# Patient Record
Sex: Female | Born: 1990 | Race: Black or African American | Hispanic: No | Marital: Single | Smoking: Current some day smoker
Health system: Southern US, Community
[De-identification: ages and names within clinical notes are randomized; demographics above are authoritative.]

## PROBLEM LIST (undated history)

## (undated) DIAGNOSIS — N83209 Unspecified ovarian cyst, unspecified side: Secondary | ICD-10-CM

## (undated) DIAGNOSIS — L309 Dermatitis, unspecified: Secondary | ICD-10-CM

## (undated) DIAGNOSIS — J45909 Unspecified asthma, uncomplicated: Secondary | ICD-10-CM

## (undated) DIAGNOSIS — F419 Anxiety disorder, unspecified: Secondary | ICD-10-CM

## (undated) DIAGNOSIS — F329 Major depressive disorder, single episode, unspecified: Secondary | ICD-10-CM

## (undated) DIAGNOSIS — F32A Depression, unspecified: Secondary | ICD-10-CM

## (undated) HISTORY — PX: HERNIA REPAIR: SHX51

---

## 2013-09-19 ENCOUNTER — Emergency Department: Payer: Self-pay

## 2013-09-19 LAB — COMPREHENSIVE METABOLIC PANEL
ALBUMIN: 3.6 g/dL (ref 3.4–5.0)
ALK PHOS: 59 U/L
Anion Gap: 6 — ABNORMAL LOW (ref 7–16)
BUN: 14 mg/dL (ref 7–18)
Bilirubin,Total: 0.8 mg/dL (ref 0.2–1.0)
CHLORIDE: 105 mmol/L (ref 98–107)
Calcium, Total: 9 mg/dL (ref 8.5–10.1)
Co2: 26 mmol/L (ref 21–32)
Creatinine: 0.71 mg/dL (ref 0.60–1.30)
EGFR (African American): >60
EGFR (Non-African Amer.): >60
GLUCOSE: 97 mg/dL (ref 65–99)
OSMOLALITY: 274 (ref 275–301)
POTASSIUM: 3.8 mmol/L (ref 3.5–5.1)
SGOT(AST): 13 U/L — ABNORMAL LOW (ref 15–37)
SGPT (ALT): 12 U/L (ref 12–78)
Sodium: 137 mmol/L (ref 136–145)
TOTAL PROTEIN: 7.5 g/dL (ref 6.4–8.2)

## 2013-09-19 LAB — CBC
HCT: 39.1 % (ref 35.0–47.0)
HGB: 12.8 g/dL (ref 12.0–16.0)
MCH: 29.6 pg (ref 26.0–34.0)
MCHC: 32.7 g/dL (ref 32.0–36.0)
MCV: 91 fL (ref 80–100)
PLATELETS: 194 10*3/uL (ref 150–440)
RBC: 4.32 10*6/uL (ref 3.80–5.20)
RDW: 12.8 % (ref 11.5–14.5)
WBC: 5.8 10*3/uL (ref 3.6–11.0)

## 2013-09-19 LAB — TSH: THYROID STIMULATING HORM: 1.83 u[IU]/mL

## 2013-09-19 LAB — ETHANOL
Ethanol %: <0.003 % (ref 0.000–0.080)
Ethanol: <3 mg/dL

## 2013-09-19 LAB — ACETAMINOPHEN LEVEL: Acetaminophen: <2 ug/mL

## 2013-09-19 LAB — SALICYLATE LEVEL: Salicylates, Serum: <1.7 mg/dL

## 2013-09-20 LAB — DRUG SCREEN, URINE
Amphetamines, Ur Screen: NEGATIVE (ref ?–1000)
BARBITURATES, UR SCREEN: NEGATIVE (ref ?–200)
Benzodiazepine, Ur Scrn: NEGATIVE (ref ?–200)
CANNABINOID 50 NG, UR ~~LOC~~: POSITIVE (ref ?–50)
Cocaine Metabolite,Ur ~~LOC~~: POSITIVE (ref ?–300)
MDMA (Ecstasy)Ur Screen: NEGATIVE (ref ?–500)
METHADONE, UR SCREEN: NEGATIVE (ref ?–300)
OPIATE, UR SCREEN: NEGATIVE (ref ?–300)
PHENCYCLIDINE (PCP) UR S: NEGATIVE (ref ?–25)
TRICYCLIC, UR SCREEN: NEGATIVE (ref ?–1000)

## 2013-09-20 LAB — URINALYSIS, COMPLETE
BACTERIA: NONE SEEN
Bilirubin,UR: NEGATIVE
Glucose,UR: NEGATIVE mg/dL (ref 0–75)
Ketone: NEGATIVE
NITRITE: NEGATIVE
Ph: 5 (ref 4.5–8.0)
Protein: NEGATIVE
Specific Gravity: 1.03 (ref 1.003–1.030)

## 2013-11-09 ENCOUNTER — Emergency Department: Payer: Self-pay | Admitting: Emergency Medicine

## 2013-11-09 LAB — URINALYSIS, COMPLETE
BILIRUBIN, UR: NEGATIVE
BLOOD: NEGATIVE
Bacteria: NONE SEEN
GLUCOSE, UR: NEGATIVE mg/dL (ref 0–75)
KETONE: NEGATIVE
LEUKOCYTE ESTERASE: NEGATIVE
NITRITE: NEGATIVE
PROTEIN: NEGATIVE
Ph: 7 (ref 4.5–8.0)
RBC,UR: 1 /HPF (ref 0–5)
Specific Gravity: 1.016 (ref 1.003–1.030)
WBC UR: <1 /HPF (ref 0–5)

## 2013-11-09 LAB — GC/CHLAMYDIA PROBE AMP

## 2013-11-09 LAB — PREGNANCY, URINE: Pregnancy Test, Urine: NEGATIVE m[IU]/mL

## 2013-11-09 LAB — WET PREP, GENITAL

## 2013-12-25 ENCOUNTER — Emergency Department: Payer: Self-pay | Admitting: Emergency Medicine

## 2013-12-25 LAB — URINALYSIS, COMPLETE
BILIRUBIN, UR: NEGATIVE
BLOOD: NEGATIVE
Bacteria: NONE SEEN
GLUCOSE, UR: NEGATIVE mg/dL (ref 0–75)
Leukocyte Esterase: NEGATIVE
NITRITE: NEGATIVE
PROTEIN: NEGATIVE
Ph: 5 (ref 4.5–8.0)
RBC,UR: <1 /HPF (ref 0–5)
Specific Gravity: 1.027 (ref 1.003–1.030)
Squamous Epithelial: 15

## 2013-12-25 LAB — WET PREP, GENITAL

## 2013-12-25 LAB — GC/CHLAMYDIA PROBE AMP

## 2014-04-18 ENCOUNTER — Emergency Department: Payer: Self-pay | Admitting: Emergency Medicine

## 2014-04-18 LAB — WET PREP, GENITAL

## 2014-04-19 LAB — GC/CHLAMYDIA PROBE AMP

## 2014-04-21 ENCOUNTER — Emergency Department: Payer: Self-pay | Admitting: Emergency Medicine

## 2014-08-06 ENCOUNTER — Emergency Department: Admit: 2014-08-06 | Disposition: A | Discharge status: Home / Self Care | Payer: Self-pay | Admitting: Student

## 2014-08-06 LAB — URINALYSIS, COMPLETE
Bacteria: NONE SEEN
Bilirubin,UR: NEGATIVE
Blood: NEGATIVE
Glucose,UR: NEGATIVE mg/dL (ref 0–75)
Ketone: NEGATIVE
Leukocyte Esterase: NEGATIVE
Nitrite: NEGATIVE
PH: 7 (ref 4.5–8.0)
PROTEIN: NEGATIVE
Specific Gravity: 1.013 (ref 1.003–1.030)

## 2014-08-06 LAB — CBC
HCT: 37 % (ref 35.0–47.0)
HGB: 11.8 g/dL — ABNORMAL LOW (ref 12.0–16.0)
MCH: 28.6 pg (ref 26.0–34.0)
MCHC: 32 g/dL (ref 32.0–36.0)
MCV: 89 fL (ref 80–100)
PLATELETS: 201 10*3/uL (ref 150–440)
RBC: 4.15 10*6/uL (ref 3.80–5.20)
RDW: 12.6 % (ref 11.5–14.5)
WBC: 5.6 10*3/uL (ref 3.6–11.0)

## 2014-08-06 LAB — COMPREHENSIVE METABOLIC PANEL
ALT: 9 U/L — AB
ANION GAP: 7 (ref 7–16)
Albumin: 4.1 g/dL
Alkaline Phosphatase: 40 U/L
BILIRUBIN TOTAL: 0.8 mg/dL
BUN: 13 mg/dL
CHLORIDE: 105 mmol/L
CO2: 26 mmol/L
Calcium, Total: 9.1 mg/dL
Creatinine: 0.68 mg/dL
EGFR (Non-African Amer.): >60
Glucose: 97 mg/dL
Potassium: 3.7 mmol/L
SGOT(AST): 15 U/L
SODIUM: 138 mmol/L
Total Protein: 7.2 g/dL

## 2014-08-06 LAB — LIPASE, BLOOD: Lipase: 49 U/L

## 2014-08-18 NOTE — Consult Note (Signed)
PATIENT NAME:  Ashley Bryan, Ashley Bryan MR#:  [id number] DATE OF BIRTH:  [birth year]  DATE OF CONSULTATION:  09/20/2013  CONSULTING PHYSICIAN:  Ashley Amel, MD  IDENTIFYING INFORMATION AND REASON FOR CONSULT: A 24 year old woman who was brought here under involuntary commitment from crisis services because of alleged suicidal ideation.   CHIEF COMPLAINT: "I didn't bring myself here." Consultation for psychiatric evaluation of patient under IVC.   HISTORY OF PRESENT ILLNESS: Information obtained from the patient and the chart. The patient went to crisis evaluation yesterday and admits that she was tearful at the time. They were under the impression that she was making statements about having suicidal ideation. The patient denies that she said anything suicidal and thinks that she was misinterpreted. She says that she has been under a lot of stress recently. She just moved to [place] from [place]. She has been off of her psychiatric medicines for several months. She finally admits to me that she did relapse into drug use when somebody offered her some cocaine recently. She says she was upset and worried that her life was going to get out of control, but denies that she had any suicidal thoughts at all. Denied that she was having any psychotic symptoms. She says she just wants to get back on her medication.   PAST PSYCHIATRIC HISTORY: The patient says she has had psychiatric problems since she was a child. She last had hospitalization when she was a young teenager, nothing in the last 7 years. Says she has been diagnosed with bipolar disorder. The only medicine she knows of that she was taking recently were Seroquel, Lexapro, and Xanax. She has been off of those for several months because she did not follow up and was not compliant with her treatment in [place]. She says she used to cut herself, but it has been years since she did that, and denies any serious suicide attempts.   SUBSTANCE ABUSE HISTORY:  Admits that she has a history of abuse, especially of cocaine, but had tried to get off it recently until she just relapsed the other day.   SOCIAL HISTORY: The patient gets disability. She has young children, 2 of whom live with her. They are currently, however, staying with her grandmother. She just moved from [place] to [place] to "make a new start."   PAST MEDICAL HISTORY: Mild asthma.   FAMILY HISTORY: Denies any.   CURRENT MEDICATIONS: None.   ALLERGIES: LATEX.   REVIEW OF SYSTEMS: Currently states she is not depressed. Denies suicidal ideation. Denies hallucinations or delusions. No pulmonary symptoms. No cardiac symptoms. No GI symptoms. Full review of systems negative.   MENTAL STATUS EXAMINATION: Neatly dressed and groomed woman, looks her stated age, cooperative with the interview. Good eye contact, normal psychomotor activity. Speech normal rate, tone and volume. Affect is slightly euphoric but controllable. Mood stated as being okay now. Thoughts are lucid. No loosening of associations. No evidence of delusions or racing thoughts or loosening of associations. Denies hallucinations. Denies suicidal or homicidal ideation. Shows adequate insight. Normal intelligence. Short and long-term memory intact. Alert and oriented x 4.   LABORATORY RESULTS: Drug screen is positive for cannabis and cocaine. Urinalysis: Borderline for infection. TSH normal. Alcohol level negative. Chemistry panel: No significant abnormalities. CBC normal.   ASSESSMENT: This is a 24 year old woman with chronic bipolar disorder, possibly borderline personality disorder, and history of substance abuse. She was upset and agitated after relapsing yesterday and made some statements that were interpreted as being suicidal, but  she is currently totally denying any suicidal ideation. She is agreeable to an outpatient plan and wants to get back on her medicine. She is lucid without psychosis. She is sober. She appears  motivated to take care of her children and get her life back on track. I do not think she meets commitment criteria anymore.   TREATMENT PLAN: She will be referred to local mental health agencies. I am also going to start her back on Seroquel 100 mg at night and Lexapro 20 mg a day, her quoted previous doses, and gave her a prescription for those. The patient was counseled about taking care of her mood disorder and completely staying off of any abusable drugs. She agrees that she needs to do this and will try and stay sober.   DIAGNOSIS, PRINCIPAL AND PRIMARY:  AXIS I: Bipolar disorder, not otherwise specified.   SECONDARY DIAGNOSES: AXIS I: Cocaine dependence.  AXIS II: Borderline personality disorder likely.  AXIS III: Mild asthma.  AXIS IV: Severe from recent stress of moving.  AXIS V: Functioning at time of evaluation 53.   ____________________________ Ashley AmelJohn T. Clapacs, MD jtc:jcm D: 09/20/2013 17:46:08 ET T: 09/20/2013 18:56:09 ET JOB#: [id number]  cc: Ashley AmelJohn T. Clapacs, MD, <Dictator> Ashley AmelJOHN T CLAPACS MD ELECTRONICALLY SIGNED 10/11/2013 13:20

## 2014-10-22 ENCOUNTER — Emergency Department (HOSPITAL_COMMUNITY): Payer: Self-pay

## 2014-10-22 ENCOUNTER — Emergency Department (HOSPITAL_COMMUNITY)
Admission: EM | Admit: 2014-10-22 | Discharge: 2014-10-22 | Discharge status: Against Medical Advice | Payer: Self-pay | Attending: Emergency Medicine | Admitting: Emergency Medicine

## 2014-10-22 ENCOUNTER — Encounter (HOSPITAL_COMMUNITY): Payer: Self-pay | Admitting: *Deleted

## 2014-10-22 DIAGNOSIS — J45909 Unspecified asthma, uncomplicated: Secondary | ICD-10-CM | POA: Insufficient documentation

## 2014-10-22 DIAGNOSIS — Z8659 Personal history of other mental and behavioral disorders: Secondary | ICD-10-CM | POA: Insufficient documentation

## 2014-10-22 DIAGNOSIS — Z872 Personal history of diseases of the skin and subcutaneous tissue: Secondary | ICD-10-CM | POA: Insufficient documentation

## 2014-10-22 DIAGNOSIS — R519 Headache, unspecified: Secondary | ICD-10-CM

## 2014-10-22 DIAGNOSIS — R51 Headache: Secondary | ICD-10-CM | POA: Insufficient documentation

## 2014-10-22 DIAGNOSIS — Z3202 Encounter for pregnancy test, result negative: Secondary | ICD-10-CM | POA: Insufficient documentation

## 2014-10-22 DIAGNOSIS — Z91040 Latex allergy status: Secondary | ICD-10-CM | POA: Insufficient documentation

## 2014-10-22 DIAGNOSIS — R52 Pain, unspecified: Secondary | ICD-10-CM

## 2014-10-22 DIAGNOSIS — R0789 Other chest pain: Secondary | ICD-10-CM | POA: Insufficient documentation

## 2014-10-22 DIAGNOSIS — R1031 Right lower quadrant pain: Secondary | ICD-10-CM | POA: Insufficient documentation

## 2014-10-22 DIAGNOSIS — Z720 Tobacco use: Secondary | ICD-10-CM | POA: Insufficient documentation

## 2014-10-22 DIAGNOSIS — Z8742 Personal history of other diseases of the female genital tract: Secondary | ICD-10-CM | POA: Insufficient documentation

## 2014-10-22 HISTORY — DX: Unspecified asthma, uncomplicated: J45.909

## 2014-10-22 HISTORY — DX: Dermatitis, unspecified: L30.9

## 2014-10-22 HISTORY — DX: Anxiety disorder, unspecified: F41.9

## 2014-10-22 HISTORY — DX: Depression, unspecified: F32.A

## 2014-10-22 HISTORY — DX: Unspecified ovarian cyst, unspecified side: N83.209

## 2014-10-22 HISTORY — DX: Major depressive disorder, single episode, unspecified: F32.9

## 2014-10-22 LAB — URINALYSIS, ROUTINE W REFLEX MICROSCOPIC
BILIRUBIN URINE: NEGATIVE
GLUCOSE, UA: NEGATIVE mg/dL
Hgb urine dipstick: NEGATIVE
Ketones, ur: NEGATIVE mg/dL
Leukocytes, UA: NEGATIVE
NITRITE: NEGATIVE
PH: 7 (ref 5.0–8.0)
Protein, ur: NEGATIVE mg/dL
Specific Gravity, Urine: 1.01 (ref 1.005–1.030)
Urobilinogen, UA: 0.2 mg/dL (ref 0.0–1.0)

## 2014-10-22 LAB — COMPREHENSIVE METABOLIC PANEL
ALT: 10 U/L — AB (ref 14–54)
AST: 13 U/L — AB (ref 15–41)
Albumin: 3.2 g/dL — ABNORMAL LOW (ref 3.5–5.0)
Alkaline Phosphatase: 38 U/L (ref 38–126)
Anion gap: 3 — ABNORMAL LOW (ref 5–15)
BUN: 11 mg/dL (ref 6–20)
CALCIUM: 8.4 mg/dL — AB (ref 8.9–10.3)
CO2: 29 mmol/L (ref 22–32)
CREATININE: 0.74 mg/dL (ref 0.44–1.00)
Chloride: 107 mmol/L (ref 101–111)
GFR calc Af Amer: >60 mL/min (ref >60)
GFR calc non Af Amer: >60 mL/min (ref >60)
Glucose, Bld: 86 mg/dL (ref 65–99)
POTASSIUM: 3.8 mmol/L (ref 3.5–5.1)
SODIUM: 139 mmol/L (ref 135–145)
TOTAL PROTEIN: 5.8 g/dL — AB (ref 6.5–8.1)
Total Bilirubin: 0.6 mg/dL (ref 0.3–1.2)

## 2014-10-22 LAB — POC URINE PREG, ED: PREG TEST UR: NEGATIVE

## 2014-10-22 LAB — CBC WITH DIFFERENTIAL/PLATELET
BASOS ABS: 0 10*3/uL (ref 0.0–0.1)
BASOS PCT: 1 % (ref 0–1)
Eosinophils Absolute: 0.2 10*3/uL (ref 0.0–0.7)
Eosinophils Relative: 4 % (ref 0–5)
HCT: 34.6 % — ABNORMAL LOW (ref 36.0–46.0)
Hemoglobin: 11.2 g/dL — ABNORMAL LOW (ref 12.0–15.0)
LYMPHS PCT: 35 % (ref 12–46)
Lymphs Abs: 1.5 10*3/uL (ref 0.7–4.0)
MCH: 29.2 pg (ref 26.0–34.0)
MCHC: 32.4 g/dL (ref 30.0–36.0)
MCV: 90.3 fL (ref 78.0–100.0)
Monocytes Absolute: 0.3 10*3/uL (ref 0.1–1.0)
Monocytes Relative: 7 % (ref 3–12)
NEUTROS ABS: 2.3 10*3/uL (ref 1.7–7.7)
Neutrophils Relative %: 53 % (ref 43–77)
PLATELETS: 192 10*3/uL (ref 150–400)
RBC: 3.83 MIL/uL — ABNORMAL LOW (ref 3.87–5.11)
RDW: 12.9 % (ref 11.5–15.5)
WBC: 4.3 10*3/uL (ref 4.0–10.5)

## 2014-10-22 NOTE — ED Notes (Signed)
Pt wanting update, instructed the pt that a doctor would see her soon.

## 2014-10-22 NOTE — ED Provider Notes (Signed)
CSN: [id number]     Arrival date & time 10/22/14  1051 History  This chart was scribed for Ashley BerkshireJoseph Cinsere Mizrahi, MD by Marica OtterNusrat Rahman, ED Scribe. This patient was seen in room APA18/APA18 and the patient's care was started at 12:48 PM.    Chief Complaint  Patient presents with  . Headache    Patient is a 24 y.o. female presenting with headaches, abdominal pain, and back pain. The history is provided by the patient. No language interpreter was used.  Headache Quality:  Sharp Severity currently:  8/10 Onset quality:  Sudden Timing:  Intermittent Chronicity:  Chronic Similar to prior headaches: yes   Relieved by:  None tried Ineffective treatments:  None tried Associated symptoms: no abdominal pain, no back pain, no congestion, no cough, no diarrhea, no fatigue, no seizures and no sinus pressure   Abdominal Pain Pain location:  RLQ Pain severity:  Unable to specify Timing:  Intermittent Chronicity:  Chronic Suspicious food intake: back pain.   Associated symptoms: no chest pain, no cough, no diarrhea, no fatigue and no hematuria   Back Pain Timing:  Intermittent Chronicity:  New Context: not falling, not jumping from heights, not MVA and not recent injury   Associated symptoms: no abdominal pain, no chest pain and no headaches     PCP: No PCP Per Patient HPI Comments: Ashley Bryan is a 24 y.o. female, with PMH noted below, who presents to the Emergency Department with multiple complaints.   Pt first complains of intermittent, 8/10, atraumatic, sharp headaches onset months ago.   Second, Pt complains of intermittent abd pain onset months ago.   Third, Pt complains of atraumatic, severe, intermittent ribcage pain onset yesterday.   Fourth, Pt complains of atraumatic, sudden onset back pain onset yesterday.   Past Medical History  Diagnosis Date  . Ovarian cyst   . Asthma   . Anxiety   . Depression   . Eczema    Past Surgical History  Procedure Laterality Date  . Hernia  repair     History reviewed. No pertinent family history. History  Substance Use Topics  . Smoking status: Current Some Day Smoker    Types: Cigarettes  . Smokeless tobacco: Not on file  . Alcohol Use: No   OB History    Gravida Para Term Preterm AB TAB SAB Ectopic Multiple Living   6 2   3  3   2      Review of Systems  Constitutional: Negative for appetite change and fatigue.  HENT: Negative for congestion, ear discharge and sinus pressure.   Eyes: Negative for discharge.  Respiratory: Negative for cough.   Cardiovascular: Negative for chest pain.  Gastrointestinal: Negative for abdominal pain and diarrhea.  Genitourinary: Negative for frequency and hematuria.  Musculoskeletal: Negative for back pain.  Skin: Negative for rash.  Neurological: Negative for seizures and headaches.  Psychiatric/Behavioral: Negative for hallucinations.   Allergies  Latex and Tylenol  Home Medications   Prior to Admission medications   Not on File   Triage Vitals: BP 124/75 mmHg  Pulse 95  Temp(Src) 98.9 F (37.2 C) (Oral)  Resp 18  Ht 5\' 4"  (1.626 m)  Wt 130 lb (58.968 kg)  BMI 22.30 kg/m2  SpO2 100%  LMP  Physical Exam  Constitutional: She is oriented to person, place, and time. She appears well-developed.  HENT:  Head: Normocephalic.  Eyes: Conjunctivae and EOM are normal. No scleral icterus.  Neck: Neck supple. No thyromegaly present.  Cardiovascular: Normal rate  and regular rhythm.  Exam reveals no gallop and no friction rub.   No murmur heard. Pulmonary/Chest: No stridor. She has no wheezes. She has no rales. She exhibits no tenderness.  Right sided chest wall tenderness   Abdominal: She exhibits no distension. There is tenderness (mild) in the right lower quadrant. There is no rebound.  Musculoskeletal: Normal range of motion. She exhibits no edema.  Lymphadenopathy:    She has no cervical adenopathy.  Neurological: She is oriented to person, place, and time. She exhibits  normal muscle tone. Coordination normal.  Skin: No rash noted. No erythema.  Psychiatric: She has a normal mood and affect. Her behavior is normal.    ED Course  Procedures (including critical care time) DIAGNOSTIC STUDIES: Oxygen Saturation is 100% on RA, nl by my interpretation.    COORDINATION OF CARE: 12:54 PM-Discussed treatment plan with pt at bedside and pt agreed to plan.   Labs Review Labs Reviewed  URINALYSIS, ROUTINE W REFLEX MICROSCOPIC (NOT AT [facility])  POC URINE PREG, ED    Imaging Review No results found.   EKG Interpretation None      MDM   Final diagnoses:  None   Pt left ama before I could discuss her results  The chart was scribed for me under my direct supervision.  I personally performed the history, physical, and medical decision making and all procedures in the evaluation of this patient.Ashley Berkshire, MD 10/22/14 1455

## 2014-10-22 NOTE — ED Notes (Signed)
Physician and nurse explained to pt he would go over results from test as soon as he could. PT stated she wanted to leave AMA. NAD noted and pt ambulated out through waiting room doors with significant other and 2 children stating loudly she didn't receive any pain medication today.

## 2014-10-22 NOTE — ED Notes (Addendum)
Frequent, sudden onset of sharp headaches in different locations over head x 3-4 days.  HAs are intermittent, lasting sometimes all day.  Applying pressure to site helps. Has not used any pain meds.  Also reports lump on R breast and pelvic pain that shifts from side to side. Last Depo shot was greater than 3 months ago. She has only had occasional spotting since then. No heterosexual activity.

## 2014-10-30 ENCOUNTER — Encounter: Payer: Self-pay | Admitting: Emergency Medicine

## 2014-10-30 ENCOUNTER — Emergency Department
Admission: EM | Admit: 2014-10-30 | Discharge: 2014-10-30 | Disposition: A | Discharge status: Home / Self Care | Payer: Self-pay | Attending: Emergency Medicine | Admitting: Emergency Medicine

## 2014-10-30 DIAGNOSIS — Z720 Tobacco use: Secondary | ICD-10-CM | POA: Insufficient documentation

## 2014-10-30 DIAGNOSIS — Z91040 Latex allergy status: Secondary | ICD-10-CM | POA: Insufficient documentation

## 2014-10-30 DIAGNOSIS — N764 Abscess of vulva: Secondary | ICD-10-CM | POA: Insufficient documentation

## 2014-10-30 MED ORDER — SULFAMETHOXAZOLE-TRIMETHOPRIM 800-160 MG PO TABS: 2.0000 | ORAL_TABLET | Freq: Two times a day (BID) | ORAL | Status: DC

## 2014-10-30 MED ORDER — LIDOCAINE-EPINEPHRINE (PF) 1 %-1:200000 IJ SOLN
INTRAMUSCULAR | Status: AC
  Filled 2014-10-30: qty 30

## 2014-10-30 MED ORDER — TRAMADOL HCL 50 MG PO TABS
ORAL_TABLET | ORAL | Status: AC
  Filled 2014-10-30: qty 2

## 2014-10-30 MED ORDER — SULFAMETHOXAZOLE-TRIMETHOPRIM 800-160 MG PO TABS
2.0000 | ORAL_TABLET | Freq: Two times a day (BID) | ORAL | Status: DC
  Administered 2014-10-30: 2 via ORAL

## 2014-10-30 MED ORDER — HYDROCODONE-ACETAMINOPHEN 5-325 MG PO TABS: 1.0000 | ORAL_TABLET | ORAL | Status: DC | PRN

## 2014-10-30 MED ORDER — SULFAMETHOXAZOLE-TRIMETHOPRIM 800-160 MG PO TABS
ORAL_TABLET | ORAL | Status: AC
  Filled 2014-10-30: qty 2

## 2014-10-30 MED ORDER — TRAMADOL HCL 50 MG PO TABS
100.0000 mg | ORAL_TABLET | Freq: Once | ORAL | Status: AC
  Administered 2014-10-30: 100 mg via ORAL

## 2014-10-30 NOTE — ED Provider Notes (Signed)
[facility] Emergency Department Provider Note ?____________________________________________ ? Time seen: 1636 ? I have reviewed the triage vital signs and the nursing notes. ________ HISTORY ? Chief Complaint Abscess  HPI  Ashley Bryan is a 24 y.o. female was to the ED for evaluation management of a vulvar abscess, for the last 2 weeks. She describes that it has been increasing in size, pain, and tightness, but denies any spontaneous drainage. She has been using warm compresses intermittently for the area. She reports history of recurrent small abscesses, but they generally resolve without intervention. She denies nausea, vomiting, but reports mild headaches, and subjective fevers. Pain at a 10 out of 10 in triage.  Past Medical History  Diagnosis Date  . Ovarian cyst   . Asthma   . Anxiety   . Depression   . Eczema    There are no active problems to display for this patient. ? Past Surgical History  Procedure Laterality Date  . Hernia repair    ? Current Outpatient Rx  Name  Route  Sig  Dispense  Refill  . HYDROcodone-acetaminophen (NORCO) 5-325 MG per tablet   Oral   Take 1 tablet by mouth every 4 (four) hours as needed for moderate pain.   10 tablet   0   . sulfamethoxazole-trimethoprim (BACTRIM DS,SEPTRA DS) 800-160 MG per tablet   Oral   Take 2 tablets by mouth 2 (two) times daily.   40 tablet   0    Allergies Latex and Tylenol ? History reviewed. No pertinent family history. ? Social History History  Substance Use Topics  . Smoking status: Current Some Day Smoker    Types: Cigarettes  . Smokeless tobacco: Not on file  . Alcohol Use: No  Review of Systems Constitutional: Negative for fever. HEENT:  Normocephalic/atraumatic. Negative for visual/hearingchanges, sore throat, or nasal congestion. Cardiovascular: Negative for chest pain. Respiratory: Negative for shortness of breath. Musculoskeletal: Negative for back  pain. Skin: vulvar abscess as above Neurological: Negative for headaches, focal weakness or numbness. Hematological/Lymphatic:Negative for enlarged lymph nodes  10-point ROS otherwise negative. ____________________________________________  PHYSICAL EXAM:  VITAL SIGNS: ED Triage Vitals  Enc Vitals Group     BP --      Pulse --      Resp --      Temp --      Temp src --      SpO2 --      Weight --      Height --      Head Cir --      Peak Flow --      Pain Score 10/30/14 1617 10     Pain Loc --      Pain Edu? --      Excl. in GC? --    Constitutional: Alert and oriented. Well appearing and in no distress. HEENT: Normocephalic and atraumatic.Conjunctivae are normal. PERRL. Normal extraocular movements. Mucous membranes are moist. Respiratory: Normal respiratory effort.  Genitourinary: normal external genitalia, except for a tense,firm, cystic lesion in the right labia. No pointing or fluctuance is noted.  Musculoskeletal: Nontender with normal range of motion in all extremities. Neurologic:  Normal speech and language. No gross focal neurologic deficits are appreciated.  Psychiatric: Mood and affect are normal. Speech and behavior are normal. Patient exhibits appropriate insight and judgment. _______________________ INCISION AND DRAINAGE  Performed by: Lissa HoardMenshew, Jenise V Bacon Consent: Verbal consent obtained. Risks and benefits: risks, benefits and alternatives were discussed Type: abscess  Body  area: vulva  Anesthesia: local infiltration  Incision was made with a scalpel.  Local anesthetic: lidocaine 1% w/ epinephrine  Anesthetic total: 3 ml  Complexity: complex Blunt dissection to break up loculations  Drainage: purulent  Drainage amount: scant  Patient tolerance: Patient tolerated the procedure well with no immediate complications.  Bactrim DS 2 tabs PO Ultram 100 mg PO ______________________________________________________ INITIAL IMPRESSION /  ASSESSMENT AND PLAN / ED COURSE ? Vulvar abscess s/p I&D. Prescription Bactrim DS for infection and Norco #10 for pain relief. Wound care instructions given.  ____________________________________________ FINAL CLINICAL IMPRESSION(S) / ED DIAGNOSES?  Final diagnoses:  Vulvar abscess      Lissa Hoard, PA-C 10/30/14 1738  I was in the ER and available for consult anytime the patient was here  Arnaldo Natal, MD 10/30/14 2231

## 2014-10-30 NOTE — Discharge Instructions (Signed)
Incision and Drainage Incision and drainage is a procedure in which a sac-like structure (cystic structure) is opened and drained. The area to be drained usually contains material such as pus, fluid, or blood.  LET YOUR CAREGIVER KNOW ABOUT:   Allergies to medicine.  Medicines taken, including vitamins, herbs, eyedrops, over-the-counter medicines, and creams.  Use of steroids (by mouth or creams).  Previous problems with anesthetics or numbing medicines.  History of bleeding problems or blood clots.  Previous surgery.  Other health problems, including diabetes and kidney problems.  Possibility of pregnancy, if this applies. RISKS AND COMPLICATIONS  Pain.  Bleeding.  Scarring.  Infection. BEFORE THE PROCEDURE  You may need to have an ultrasound or other imaging tests to see how large or deep your cystic structure is. Blood tests may also be used to determine if you have an infection or how severe the infection is. You may need to have a tetanus shot. PROCEDURE  The affected area is cleaned with a cleaning fluid. The cyst area will then be numbed with a medicine (local anesthetic). A small incision will be made in the cystic structure. A syringe or catheter may be used to drain the contents of the cystic structure, or the contents may be squeezed out. The area will then be flushed with a cleansing solution. After cleansing the area, it is often gently packed with a gauze or another wound dressing. Once it is packed, it will be covered with gauze and tape or some other type of wound dressing. AFTER THE PROCEDURE   Often, you will be allowed to go home right after the procedure.  You may be given antibiotic medicine to prevent or heal an infection.  If the area was packed with gauze or some other wound dressing, you will likely need to come back in 1 to 2 days to get it removed.  The area should heal in about 14 days. Document Released: 10/07/2000 Document Revised: 10/13/2011  Document Reviewed: 06/08/2011 [facility] Patient Information 2015 [place]. This information is not intended to replace advice given to you by your health care provider. Make sure you discuss any questions you have with your health care provider.  Vulvar Abscess  The vulva is made up of the large and small flaps of skin around the vagina opening. A vulvar abscess is an infected sac of yellowish white fluid (pus) in the skin flaps. Your doctor may make a small cut in the skin to drain the vulvar abscess.  HOME CARE  Only take medicine as told by your doctor.  Soak or take a warm water bath (sitz bath) 3 to 4 times a day for 15 to 20 minutes.  After you pee (urinate), always wipe from front to back.  Clean the vulvar abscess with soap and warm water. Do this after going to the bathroom.  Wear loose-fitting clothing.  Do not have sex until the vulvar abscess is gone or as told by your doctor. GET HELP RIGHT AWAY IF:   You have a temperature by mouth above 102 F (38.9 C).  The vulva area becomes more painful, puffy (swollen), or red.  You have fluid coming from the vulva area that is red or tan.  You have pain when you pee or have a hard time peeing. MAKE SURE YOU:  Understand these instructions.  Will watch your condition.  Will get help if you are not doing well or get worse. Document Released: 07/10/2008 Document Revised: 07/06/2011 Document Reviewed: 07/10/2008 ExitCare  Patient Information 2015 [place], [place]. This information is not intended to replace advice given to you by your health care provider. Make sure you discuss any questions you have with your health care provider.  Keep the area clean, dry, and covered.  Use warm, moist compresses daily to promote healing.  Take the prescription meds as directed for infection and pain relief. Return as needed for follow-up wound check.

## 2014-10-30 NOTE — ED Notes (Signed)
C/o abscess to vaginal area x 2 weeks, denies any drainage

## 2014-10-30 NOTE — ED Notes (Signed)
Pt to ed with c/o ? Abscess to vaginal area.  Pt states it has been there for about 2 weeks. Denies drainage.

## 2014-12-04 ENCOUNTER — Emergency Department
Admission: EM | Admit: 2014-12-04 | Discharge: 2014-12-06 | Disposition: A | Discharge status: Other Acute Inpatient Hospital | Payer: Self-pay | Attending: Emergency Medicine | Admitting: Emergency Medicine

## 2014-12-04 ENCOUNTER — Encounter: Payer: Self-pay | Admitting: *Deleted

## 2014-12-04 DIAGNOSIS — F3163 Bipolar disorder, current episode mixed, severe, without psychotic features: Secondary | ICD-10-CM

## 2014-12-04 DIAGNOSIS — Z91040 Latex allergy status: Secondary | ICD-10-CM | POA: Insufficient documentation

## 2014-12-04 DIAGNOSIS — Z720 Tobacco use: Secondary | ICD-10-CM | POA: Insufficient documentation

## 2014-12-04 DIAGNOSIS — Z792 Long term (current) use of antibiotics: Secondary | ICD-10-CM | POA: Insufficient documentation

## 2014-12-04 DIAGNOSIS — F418 Other specified anxiety disorders: Secondary | ICD-10-CM | POA: Insufficient documentation

## 2014-12-04 DIAGNOSIS — Z3202 Encounter for pregnancy test, result negative: Secondary | ICD-10-CM | POA: Insufficient documentation

## 2014-12-04 LAB — URINE DRUG SCREEN, QUALITATIVE (ARMC ONLY)
AMPHETAMINES, UR SCREEN: NOT DETECTED
BENZODIAZEPINE, UR SCRN: NOT DETECTED
Barbiturates, Ur Screen: NOT DETECTED
COCAINE METABOLITE, UR ~~LOC~~: NOT DETECTED
Cannabinoid 50 Ng, Ur ~~LOC~~: POSITIVE — AB
MDMA (Ecstasy)Ur Screen: NOT DETECTED
METHADONE SCREEN, URINE: NOT DETECTED
Opiate, Ur Screen: NOT DETECTED
Phencyclidine (PCP) Ur S: NOT DETECTED
TRICYCLIC, UR SCREEN: NOT DETECTED

## 2014-12-04 LAB — URINALYSIS COMPLETE WITH MICROSCOPIC (ARMC ONLY)
BACTERIA UA: NONE SEEN
Bilirubin Urine: NEGATIVE
GLUCOSE, UA: NEGATIVE mg/dL
Ketones, ur: NEGATIVE mg/dL
Nitrite: NEGATIVE
Protein, ur: NEGATIVE mg/dL
Specific Gravity, Urine: 1.016 (ref 1.005–1.030)
pH: 6 (ref 5.0–8.0)

## 2014-12-04 LAB — CBC WITH DIFFERENTIAL/PLATELET
Basophils Absolute: 0.1 10*3/uL (ref 0–0.1)
Basophils Relative: 1 %
EOS PCT: 2 %
Eosinophils Absolute: 0.1 10*3/uL (ref 0–0.7)
HEMATOCRIT: 39.9 % (ref 35.0–47.0)
Hemoglobin: 12.9 g/dL (ref 12.0–16.0)
LYMPHS ABS: 1.5 10*3/uL (ref 1.0–3.6)
Lymphocytes Relative: 21 %
MCH: 28.7 pg (ref 26.0–34.0)
MCHC: 32.3 g/dL (ref 32.0–36.0)
MCV: 88.9 fL (ref 80.0–100.0)
Monocytes Absolute: 0.4 10*3/uL (ref 0.2–0.9)
Monocytes Relative: 6 %
NEUTROS ABS: 4.9 10*3/uL (ref 1.4–6.5)
NEUTROS PCT: 70 %
Platelets: 230 10*3/uL (ref 150–440)
RBC: 4.49 MIL/uL (ref 3.80–5.20)
RDW: 12.9 % (ref 11.5–14.5)
WBC: 7 10*3/uL (ref 3.6–11.0)

## 2014-12-04 LAB — PREGNANCY, URINE: Preg Test, Ur: NEGATIVE

## 2014-12-04 LAB — BASIC METABOLIC PANEL
ANION GAP: 7 (ref 5–15)
BUN: 14 mg/dL (ref 6–20)
CALCIUM: 8.9 mg/dL (ref 8.9–10.3)
CO2: 28 mmol/L (ref 22–32)
CREATININE: 0.73 mg/dL (ref 0.44–1.00)
Chloride: 104 mmol/L (ref 101–111)
GFR calc Af Amer: >60 mL/min (ref >60)
GFR calc non Af Amer: >60 mL/min (ref >60)
GLUCOSE: 115 mg/dL — AB (ref 65–99)
POTASSIUM: 3.8 mmol/L (ref 3.5–5.1)
Sodium: 139 mmol/L (ref 135–145)

## 2014-12-04 LAB — ACETAMINOPHEN LEVEL: Acetaminophen (Tylenol), Serum: <10 ug/mL — ABNORMAL LOW (ref 10–30)

## 2014-12-04 LAB — SALICYLATE LEVEL: Salicylate Lvl: <4 mg/dL (ref 2.8–30.0)

## 2014-12-04 LAB — POCT PREGNANCY, URINE: Preg Test, Ur: NEGATIVE

## 2014-12-04 MED ORDER — LORAZEPAM 2 MG PO TABS
2.0000 mg | ORAL_TABLET | Freq: Once | ORAL | Status: AC
  Administered 2014-12-04: 2 mg via ORAL

## 2014-12-04 MED ORDER — LORAZEPAM 2 MG/ML IJ SOLN
INTRAMUSCULAR | Status: AC
  Filled 2014-12-04: qty 1

## 2014-12-04 MED ORDER — ZIPRASIDONE MESYLATE 20 MG IM SOLR: 20.0000 mg | Freq: Once | INTRAMUSCULAR | Status: DC

## 2014-12-04 MED ORDER — QUETIAPINE FUMARATE 25 MG PO TABS
ORAL_TABLET | ORAL | Status: AC
  Filled 2014-12-04: qty 4

## 2014-12-04 MED ORDER — QUETIAPINE FUMARATE 25 MG PO TABS
100.0000 mg | ORAL_TABLET | Freq: Every day | ORAL | Status: DC
  Administered 2014-12-04 – 2014-12-05 (×2): 100 mg via ORAL
  Filled 2014-12-04: qty 4

## 2014-12-04 MED ORDER — NICOTINE 10 MG IN INHA
RESPIRATORY_TRACT | Status: AC
  Filled 2014-12-04: qty 36

## 2014-12-04 MED ORDER — DIPHENHYDRAMINE HCL 50 MG PO CAPS
50.0000 mg | ORAL_CAPSULE | Freq: Once | ORAL | Status: AC
  Administered 2014-12-04: 50 mg via ORAL

## 2014-12-04 MED ORDER — ZIPRASIDONE MESYLATE 20 MG IM SOLR
INTRAMUSCULAR | Status: AC
  Filled 2014-12-04: qty 20

## 2014-12-04 MED ORDER — DIPHENHYDRAMINE HCL 50 MG PO CAPS
ORAL_CAPSULE | ORAL | Status: AC
  Filled 2014-12-04: qty 1

## 2014-12-04 MED ORDER — LORAZEPAM 2 MG PO TABS
ORAL_TABLET | ORAL | Status: AC
  Filled 2014-12-04: qty 1

## 2014-12-04 MED ORDER — NICOTINE 10 MG IN INHA
1.0000 | RESPIRATORY_TRACT | Status: DC | PRN
  Administered 2014-12-04: 1 via RESPIRATORY_TRACT

## 2014-12-04 MED ORDER — DIPHENHYDRAMINE HCL 50 MG/ML IJ SOLN
INTRAMUSCULAR | Status: AC
  Filled 2014-12-04: qty 1

## 2014-12-04 MED ORDER — ZIPRASIDONE HCL 20 MG PO CAPS
20.0000 mg | ORAL_CAPSULE | Freq: Two times a day (BID) | ORAL | Status: DC
  Administered 2014-12-05 (×2): 20 mg via ORAL
  Filled 2014-12-04 (×6): qty 1

## 2014-12-04 NOTE — Consult Note (Signed)
[facility] Face-to-Face Psychiatry Consult   Reason for Consult:  Consult for this 24 year old woman with a past history of mood instability who comes here on involuntary commitment from [facility] Referring Physician:  Quale Patient Identification: Ashley Bryan MRN:  [id number] Principal Diagnosis: Bipolar disorder, curr episode mixed, severe, w/o psychotic features Diagnosis:   Patient Active Problem List   Diagnosis Date Noted  . Bipolar disorder, curr episode mixed, severe, w/o psychotic features [F31.63] 12/04/2014    Total Time spent with patient: 1 hour  Subjective:   Ashley Bryan is a 24 y.o. female patient admitted with "I just need to get myself together".  HPI:  History from the patient and the chart. The patient was sent here on commitment petition from [facility] and is accompanied by an evaluation done today by Dr. Clovis Riley at [facility]. Patient presented there with multiple complaints and a lot of emotional lability. She gave him essentially what appears to be the same history and presentation she is showing here. She complains of a large number of severe stresses in her life including legal problems with the fear that she will be sent to jail. Worries a lot about her children. She seems to indicate that she sleeps poorly. Her mood seems to feel upset much of the time. I was not able to get to the point of inquiring about suicidality but she told Dr. Clovis Riley that she had suicidal thoughts today. She denies that she's been abusing any drugs recently. She has not gone for any outpatient treatment since she was last evaluated here 1 year ago. Patient is disorganized in her thinking but it was not possible to pin down specific delusions.  Past psychiatric history: Patient was seen by me here in the emergency room in spring of 2015 and at that time gave a history of long-standing mood lability. She was not committable at that time and was discharged from the emergency room on Seroquel and Lexapro. Sounds  like she has been only intermittently compliant with it and has somehow managed to still have a few around although I don't understand how that happens and she hasn't gone for any treatment in between. She was not able to tell me this time about any history of suicidality or past hospitalizations. I know that she does have a past history of bipolar disorder but also a past history of substance abuse problems.  Medical history: No significant medical problems identified outside of her mental health issues  Social history: Patient has 2 children ages 61 and 96 who live with her. I am unclear as to whether she is living with a partner right now. Patient is gay and has made multiple references to previous girlfriends but I'm not clear how involved they are in her life right now. It sounds like the children are currently in the custody of her grandmother.  Family history: Unknown  Substance abuse history: History of substance abuse problems in the past but says that she's been staying sober recently. HPI Elements:   Quality:  Mood instability and agitation. Severity:  Moderate possibly severe. Timing:  Long-standing intermittent problem. Duration:  Still going on here in the emergency room. Context:  Multiple legal problems.  Past Medical History:  Past Medical History  Diagnosis Date  . Ovarian cyst   . Asthma   . Anxiety   . Depression   . Eczema     Past Surgical History  Procedure Laterality Date  . Hernia repair     Family History: No family  history on file. Social History:  History  Alcohol Use No     History  Drug Use  . Yes  . Special: Marijuana    History   Social History  . Marital Status: Single    Spouse Name: N/A  . Number of Children: N/A  . Years of Education: N/A   Social History Main Topics  . Smoking status: Current Some Day Smoker    Types: Cigarettes  . Smokeless tobacco: Not on file  . Alcohol Use: No  . Drug Use: Yes    Special: Marijuana  . Sexual  Activity: Yes    Birth Control/ Protection: None     Comment: homosexual activity   Other Topics Concern  . None   Social History Narrative   Additional Social History:                          Allergies:   Allergies  Allergen Reactions  . Latex Rash  . Tylenol [Acetaminophen] Rash    Makes her ezcema worse    Labs:  Results for orders placed or performed during the hospital encounter of 12/04/14 (from the past 48 hour(s))  Pregnancy, urine     Status: None   Collection Time: 12/04/14  2:58 PM  Result Value Ref Range   Preg Test, Ur NEGATIVE NEGATIVE  Urinalysis complete, with microscopic     Status: Abnormal   Collection Time: 12/04/14  2:58 PM  Result Value Ref Range   Color, Urine YELLOW (A) YELLOW   APPearance HAZY (A) CLEAR   Glucose, UA NEGATIVE NEGATIVE mg/dL   Bilirubin Urine NEGATIVE NEGATIVE   Ketones, ur NEGATIVE NEGATIVE mg/dL   Specific Gravity, Urine 1.016 1.005 - 1.030   Hgb urine dipstick 1+ (A) NEGATIVE   pH 6.0 5.0 - 8.0   Protein, ur NEGATIVE NEGATIVE mg/dL   Nitrite NEGATIVE NEGATIVE   Leukocytes, UA TRACE (A) NEGATIVE   RBC / HPF 0-5 0 - 5 RBC/hpf   WBC, UA 0-5 0 - 5 WBC/hpf   Bacteria, UA NONE SEEN NONE SEEN   Squamous Epithelial / LPF 0-5 (A) NONE SEEN   Mucous PRESENT   Urine Drug Screen, Qualitative     Status: Abnormal   Collection Time: 12/04/14  2:58 PM  Result Value Ref Range   Tricyclic, Ur Screen NONE DETECTED NONE DETECTED   Amphetamines, Ur Screen NONE DETECTED NONE DETECTED   MDMA (Ecstasy)Ur Screen NONE DETECTED NONE DETECTED   Cocaine Metabolite,Ur [place] NONE DETECTED NONE DETECTED   Opiate, Ur Screen NONE DETECTED NONE DETECTED   Phencyclidine (PCP) Ur S NONE DETECTED NONE DETECTED   Cannabinoid 50 Ng, Ur [place] POSITIVE (A) NONE DETECTED   Barbiturates, Ur Screen NONE DETECTED NONE DETECTED   Benzodiazepine, Ur Scrn NONE DETECTED NONE DETECTED   Methadone Scn, Ur NONE DETECTED NONE DETECTED    Comment: (NOTE) 124   Tricyclics, urine               Cutoff 1000 ng/mL 200  Amphetamines, urine             Cutoff 1000 ng/mL 300  MDMA (Ecstasy), urine           Cutoff 500 ng/mL 400  Cocaine Metabolite, urine       Cutoff 300 ng/mL 500  Opiate, urine                   Cutoff 300 ng/mL 600  Phencyclidine (  PCP), urine      Cutoff 25 ng/mL 700  Cannabinoid, urine              Cutoff 50 ng/mL 800  Barbiturates, urine             Cutoff 200 ng/mL 900  Benzodiazepine, urine           Cutoff 200 ng/mL 1000 Methadone, urine                Cutoff 300 ng/mL 1100 1200 The urine drug screen provides only a preliminary, unconfirmed 1300 analytical test result and should not be used for non-medical 1400 purposes. Clinical consideration and professional judgment should 1500 be applied to any positive drug screen result due to possible 1600 interfering substances. A more specific alternate chemical method 1700 must be used in order to obtain a confirmed analytical result.  1800 Gas chromato graphy / mass spectrometry (GC/MS) is the preferred 1900 confirmatory method.   Basic metabolic panel     Status: Abnormal   Collection Time: 12/04/14  3:19 PM  Result Value Ref Range   Sodium 139 135 - 145 mmol/L   Potassium 3.8 3.5 - 5.1 mmol/L   Chloride 104 101 - 111 mmol/L   CO2 28 22 - 32 mmol/L   Glucose, Bld 115 (H) 65 - 99 mg/dL   BUN 14 6 - 20 mg/dL   Creatinine, Ser 0.73 0.44 - 1.00 mg/dL   Calcium 8.9 8.9 - 10.3 mg/dL   GFR calc non Af Amer >60 >60 mL/min   GFR calc Af Amer >60 >60 mL/min    Comment: (NOTE) The eGFR has been calculated using the CKD EPI equation. This calculation has not been validated in all clinical situations. eGFR's persistently <60 mL/min signify possible Chronic Kidney Disease.    Anion gap 7 5 - 15  Salicylate level     Status: None   Collection Time: 12/04/14  3:19 PM  Result Value Ref Range   Salicylate Lvl <4.7 2.8 - 30.0 mg/dL  Acetaminophen level     Status: Abnormal    Collection Time: 12/04/14  3:19 PM  Result Value Ref Range   Acetaminophen (Tylenol), Serum <10 (L) 10 - 30 ug/mL    Comment:        THERAPEUTIC CONCENTRATIONS VARY SIGNIFICANTLY. A RANGE OF 10-30 ug/mL MAY BE AN EFFECTIVE CONCENTRATION FOR MANY PATIENTS. HOWEVER, SOME ARE BEST TREATED AT CONCENTRATIONS OUTSIDE THIS RANGE. ACETAMINOPHEN CONCENTRATIONS >150 ug/mL AT 4 HOURS AFTER INGESTION AND >50 ug/mL AT 12 HOURS AFTER INGESTION ARE OFTEN ASSOCIATED WITH TOXIC REACTIONS.   CBC with Differential     Status: None   Collection Time: 12/04/14  3:19 PM  Result Value Ref Range   WBC 7.0 3.6 - 11.0 K/uL   RBC 4.49 3.80 - 5.20 MIL/uL   Hemoglobin 12.9 12.0 - 16.0 g/dL   HCT 39.9 35.0 - 47.0 %   MCV 88.9 80.0 - 100.0 fL   MCH 28.7 26.0 - 34.0 pg   MCHC 32.3 32.0 - 36.0 g/dL   RDW 12.9 11.5 - 14.5 %   Platelets 230 150 - 440 K/uL   Neutrophils Relative % 70 %   Neutro Abs 4.9 1.4 - 6.5 K/uL   Lymphocytes Relative 21 %   Lymphs Abs 1.5 1.0 - 3.6 K/uL   Monocytes Relative 6 %   Monocytes Absolute 0.4 0.2 - 0.9 K/uL   Eosinophils Relative 2 %   Eosinophils Absolute 0.1 0 - 0.7  K/uL   Basophils Relative 1 %   Basophils Absolute 0.1 0 - 0.1 K/uL  Pregnancy, urine POC     Status: None   Collection Time: 12/04/14  3:21 PM  Result Value Ref Range   Preg Test, Ur NEGATIVE NEGATIVE    Comment:        THE SENSITIVITY OF THIS METHODOLOGY IS >24 mIU/mL     Vitals: Blood pressure 108/66, temperature 98.3 F (36.8 C), temperature source Oral, resp. rate 20, height 5' 5" (1.651 m), weight 63.504 kg (140 lb), SpO2 99 %.  Risk to Self: Is patient at risk for suicide?: No, but patient needs Medical Clearance Risk to Others:   Prior Inpatient Therapy:   Prior Outpatient Therapy:    Current Facility-Administered Medications  Medication Dose Route Frequency Provider Last Rate Last Dose  . diphenhydrAMINE (BENADRYL) 50 MG/ML injection           . LORazepam (ATIVAN) 2 MG/ML injection            . ziprasidone (GEODON) 20 MG injection           . ziprasidone (GEODON) capsule 20 mg  20 mg Oral BID WC Carrie Mew, MD      . ziprasidone (GEODON) injection 20 mg  20 mg Intramuscular Once Delman Kitten, MD       Current Outpatient Prescriptions  Medication Sig Dispense Refill  . HYDROcodone-acetaminophen (NORCO) 5-325 MG per tablet Take 1 tablet by mouth every 4 (four) hours as needed for moderate pain. 10 tablet 0  . sulfamethoxazole-trimethoprim (BACTRIM DS,SEPTRA DS) 800-160 MG per tablet Take 2 tablets by mouth 2 (two) times daily. 40 tablet 0    Musculoskeletal: Strength & Muscle Tone: within normal limits Gait & Station: normal Patient leans: N/A  Psychiatric Specialty Exam: Physical Exam  Constitutional: She appears well-developed and well-nourished.  HENT:  Head: Normocephalic and atraumatic.  Eyes: Conjunctivae are normal. Pupils are equal, round, and reactive to light.  Neck: Normal range of motion.  Cardiovascular: Normal heart sounds.   Respiratory: Effort normal.  GI: Soft.  Musculoskeletal: Normal range of motion.  Neurological: She is alert.  Skin: Skin is warm and dry.  Psychiatric: Her affect is labile. Her speech is tangential. She is agitated. Thought content is paranoid. Cognition and memory are normal. She expresses impulsivity and inappropriate judgment. She expresses suicidal ideation.  Patient presents as a neatly groomed woman looks her stated age. Her affect is very labile. Speech content is tangential to the point of being almost incoherent at times. Very emotionally driven and upset. Made suicidal statements earlier today to the psychiatrist at [facility]  Constitutional: Negative.   HENT: Negative.   Eyes: Negative.   Respiratory: Negative.   Cardiovascular: Negative.   Gastrointestinal: Negative.   Musculoskeletal: Negative.   Skin: Negative.   Neurological: Negative.   Psychiatric/Behavioral: Positive for depression and  suicidal ideas. Negative for hallucinations and substance abuse. The patient is nervous/anxious and has insomnia.     Blood pressure 108/66, temperature 98.3 F (36.8 C), temperature source Oral, resp. rate 20, height 5' 5" (1.651 m), weight 63.504 kg (140 lb), SpO2 99 %.Body mass index is 23.3 kg/(m^2).  General Appearance: Fairly Groomed  Engineer, water::  Fair  Speech:  Pressured  Volume:  Increased  Mood:  Irritable  Affect:  Labile  Thought Process:  Circumstantial and Tangential  Orientation:  Full (Time, Place, and Person)  Thought Content:  Unclear. Could not  pin down her history well enough to be certain  Suicidal Thoughts:  Yes.  with intent/plan  Homicidal Thoughts:  No  Memory:  Immediate;   Fair Recent;   Fair Remote;   Fair  Judgement:  Impaired  Insight:  Lacking  Psychomotor Activity:  Restlessness  Concentration:  Poor  Recall:  [employer] of Knowledge:Good  Language: Fair  Akathisia:  No  Handed:  Right  AIMS (if indicated):     Assets:  Communication Skills Desire for Improvement Physical Health  ADL's:  Intact  Cognition: WNL  Sleep:      Medical Decision Making: Review of Psycho-Social Stressors (1), Review or order clinical lab tests (1), Established Problem, Worsening (2), Review or order medicine tests (1) and Review of Medication Regimen & Side Effects (2)  Treatment Plan Summary: Plan Patient is presenting as an emotionally labile and upset agitated young woman. It's unclear whether she is having any specifically psychotic symptoms. She has been at times verbally explosive here in the emergency room but has not made an effort to hurt herself or anyone else. She made suicidal statements at [facility] before being referred here. As reflected in Dr. Georgina Snell note I would agree that it is hard to determine if this could be a bipolar disorder or a personality disorder as the 2 most likely explanations. Drug screen is negative. Does not seem to be substance induced.  Currently we do not have any beds available on the psychiatry ward. I have advised the emergency room doctor to try a injection with 20 mg of intramuscular Geodon for her acute agitation. I will put her back on Seroquel that she was taking previously. Patient can be reevaluated if we have bed space available tomorrow and possibly admitted at that time.  Plan:  Recommend psychiatric Inpatient admission when medically cleared. Supportive therapy provided about ongoing stressors. Disposition: Continue monitoring in the emergency room until bed space available  Alethia Berthold 12/04/2014 5:41 PM

## 2014-12-04 NOTE — ED Notes (Signed)
Pt. Noted sleeping in room. No complaints or concerns voiced. No distress or abnormal behavior noted. Will continue to monitor with security cameras. Q 15 minute rounds continue. 

## 2014-12-04 NOTE — ED Notes (Signed)
Pt arrives in handcuffs, crying and cursing and upset.

## 2014-12-04 NOTE — ED Notes (Signed)
Pt. To [facility] from ED ambulatory without difficulty, to room  . Report from RN. Pt. Is alert and oriented, warm and dry in no distress. Pt. Denies SI, HI, and AVH. Pt. Calm and cooperative. Pt. Made aware of security cameras and Q15 minute rounds. Pt. Encouraged to let Nursing staff know of any concerns or needs.   

## 2014-12-04 NOTE — ED Notes (Addendum)
Pt. Noted sleeping in room. No complaints or concerns voiced. No distress or abnormal behavior noted. Will continue to monitor with security cameras. Q 15 minute rounds continue. 

## 2014-12-04 NOTE — ED Notes (Signed)
Pt. Noted in room. Pt. C/o insomnia. No distress or abnormal behavior noted. Will continue to monitor with security cameras. Q 15 minute rounds continue. 

## 2014-12-04 NOTE — ED Provider Notes (Addendum)
-----------------------------------------   5:28 PM on 12/04/2014 -----------------------------------------   Blood pressure 108/66, temperature 98.3 F (36.8 C), temperature source Oral, resp. rate 20, height  (1.651 m), weight 140 lb (63.504 kg), SpO2 99 %.  Patient has begun yelling out at the room, yelling profanities at Dr. Toni Amend because she is upset with his assessment. The patient is very agitated at this time, she is making aggressive gestures and swearing profanely at the top of her lungs. Discussed with Dr. Toni Amend and we will administer Geodon 20 mg, and continue her on her IVC. May require additional sedation thereafter, but we'll continue to monitor.     Sharyn Creamer, MD 12/04/14 1728  ----------------------------------------- 6:10 PM on 12/04/2014 -----------------------------------------  Patient currently resting comfortably. No further outbursts or agitation. Medically cleared for psychiatric evaluation and referral at this time.  Sharyn Creamer, MD 12/04/14 [phone]
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
## 2014-12-04 NOTE — ED Notes (Signed)
Psychiatrist is at the bedside with psych pa seeing pt.  Pt has been calm and cooperative

## 2014-12-05 MED ORDER — LORAZEPAM 2 MG/ML IJ SOLN
2.0000 mg | Freq: Once | INTRAMUSCULAR | Status: AC
  Administered 2014-12-05: 2 mg via INTRAMUSCULAR

## 2014-12-05 MED ORDER — HALOPERIDOL LACTATE 5 MG/ML IJ SOLN
10.0000 mg | Freq: Once | INTRAMUSCULAR | Status: AC
  Administered 2014-12-05: 10 mg via INTRAMUSCULAR

## 2014-12-05 MED ORDER — LORAZEPAM 2 MG/ML IJ SOLN
INTRAMUSCULAR | Status: AC
  Filled 2014-12-05: qty 1

## 2014-12-05 MED ORDER — DIPHENHYDRAMINE HCL 50 MG/ML IJ SOLN
INTRAMUSCULAR | Status: AC
Start: 2014-12-05 — End: 2014-12-05
  Filled 2014-12-05: qty 1

## 2014-12-05 MED ORDER — DIPHENHYDRAMINE HCL 50 MG/ML IJ SOLN
50.0000 mg | Freq: Once | INTRAMUSCULAR | Status: AC
  Administered 2014-12-05: 50 mg via INTRAMUSCULAR

## 2014-12-05 MED ORDER — HALOPERIDOL LACTATE 5 MG/ML IJ SOLN
INTRAMUSCULAR | Status: AC
Start: 2014-12-05 — End: 2014-12-05
  Filled 2014-12-05: qty 1

## 2014-12-05 NOTE — ED Notes (Signed)
BEHAVIORAL HEALTH ROUNDING Patient sleeping: No. Patient alert and oriented: yes Behavior appropriate: No.; If no, describe:  Nutrition and fluids offered: Yes  Toileting and hygiene offered: Yes  Sitter present: no Law enforcement present: Yes  

## 2014-12-05 NOTE — ED Notes (Signed)

## 2014-12-05 NOTE — ED Notes (Signed)
Once pt was awakened today for meds pt began to become irate, yelling requesting to use phone.

## 2014-12-05 NOTE — ED Notes (Signed)
Pt tolerated taking meds well, calm and cooperative, given phone at this time to contact grandmother via Engineer, materials.

## 2014-12-05 NOTE — ED Notes (Signed)
Orders received for IM meds, talked to pt extensively for over 15 minutes.  Pt finally agreed to take IM meds without difficulty

## 2014-12-05 NOTE — ED Notes (Signed)
BEHAVIORAL HEALTH ROUNDING Patient sleeping: Yes.   Patient alert and oriented: no Behavior appropriate: Yes.  ; If no, describe:  Nutrition and fluids offered: Yes  Toileting and hygiene offered: Yes  Sitter present: no Law enforcement present: Yes  
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
## 2014-12-05 NOTE — ED Notes (Signed)
BEHAVIORAL HEALTH ROUNDING Patient sleeping: Yes.  , chest rise and fall noted, no acute distress noted  Patient alert and oriented: yes Behavior appropriate: Yes.  ;  Nutrition and fluids offered: Yes  Toileting and hygiene offered: Yes  Sitter present: yes Law enforcement present: Yes

## 2014-12-05 NOTE — ED Notes (Signed)
Pt becoming more irritated, yelling, screaming using profanities.  Pt banging on door, yelling.  Called and notified charge RN, and ed MD.

## 2014-12-05 NOTE — ED Notes (Signed)
Pt sleeping at this time, continuing to monitor through cameras.  Awaiting breakfast.

## 2014-12-05 NOTE — ED Notes (Signed)
Pt. Noted sleeping in room. No complaints or concerns voiced. No distress or abnormal behavior noted. Will continue to monitor with security cameras. Q 15 minute rounds continue. 

## 2014-12-05 NOTE — ED Notes (Signed)
ED BHU PLACEMENT JUSTIFICATION Is the patient under IVC or is there intent for IVC: Yes.   Is the patient medically cleared: Yes.   Is there vacancy in the ED BHU: Yes.   Is the population mix appropriate for patient: Yes.   Is the patient awaiting placement in inpatient or outpatient setting: Yes.   Has the patient had a psychiatric consult: No. Survey of unit performed for contraband, proper placement and condition of furniture, tampering with fixtures in bathroom, shower, and each patient room: Yes.  ; Findings:  APPEARANCE/BEHAVIOR uncooperative NEURO ASSESSMENT Orientation: place Hallucinations: No.None noted (Hallucinations) Speech: Pressured Gait: normal RESPIRATORY ASSESSMENT No chest wall tenderness. CARDIOVASCULAR ASSESSMENT regular rate and rhythm, S1, S2 normal, no murmur, click, rub or gallop GASTROINTESTINAL ASSESSMENT soft, nontender, BS WNL, no r/g EXTREMITIES normal strength, tone, and muscle mass, no deformities, no erythema, induration, or nodules PLAN OF CARE Provide calm/safe environment. Vital signs assessed twice daily. ED BHU Assessment once each 12-hour shift. Collaborate with intake RN daily or as condition indicates. Assure the ED provider has rounded once each shift. Provide and encourage hygiene. Provide redirection as needed. Assess for escalating behavior; address immediately and inform ED provider.  Assess family dynamic and appropriateness for visitation as needed: Yes.  ; If necessary, describe findings: pt screaming profanities at this time.  Educate the patient/family about BHU procedures/visitation: Yes.  ; If necessary, describe findings:
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
## 2014-12-05 NOTE — ED Notes (Addendum)
Pt resting at this time, 15 minutes rounds continue with observation by security cameras.

## 2014-12-05 NOTE — ED Notes (Signed)
Report received from [facility]. Pt currently sleeping in bed comfortably at this time, symmetrical chest rise and fall noted. No acute distress noted at this time.

## 2014-12-05 NOTE — ED Notes (Signed)
Patient assigned to appropriate care area. Patient oriented to unit/care area: Informed that, for their safety, care areas are designed for safety and monitored by security cameras at all times; and visiting hours explained to patient. Patient verbalizes understanding, and verbal contract for safety obtained. 

## 2014-12-05 NOTE — ED Notes (Signed)
BEHAVIORAL HEALTH ROUNDING  Patient sleeping: Yes.  Patient alert and oriented: Pt is sleeping.  Behavior appropriate: Yes. ; If no, describe: Pt is sleeping.  Nutrition and fluids offered: No  Toileting and hygiene offered: No  Sitter present: yes  Law enforcement present: Yes   

## 2014-12-05 NOTE — ED Notes (Addendum)
Dr. Mayford Knife at bedside, Tammy Sours RN also at bedside. Security called for back up,  Pt continues to yell and scream profanities.
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
## 2014-12-05 NOTE — ED Notes (Addendum)
BEHAVIORAL HEALTH ROUNDING Patient sleeping: Yes.   Patient alert and oriented: yes Behavior appropriate: Yes.  ;  Nutrition and fluids offered: yes Toileting and hygiene offered: Yes  Sitter present: no not needed per md order Law enforcement present: Yes

## 2014-12-05 NOTE — ED Notes (Signed)
Report received from Gary RN, care assumed.  Pt sleeping at this time.  

## 2014-12-05 NOTE — ED Notes (Signed)
Report to Johnney Ou RN.

## 2014-12-05 NOTE — ED Notes (Signed)
ED BHU PLACEMENT JUSTIFICATION Is the patient under IVC or is there intent for IVC: Yes.   Is the patient medically cleared: Yes.   Is there vacancy in the ED BHU: Yes.   Is the population mix appropriate for patient: Yes.   Is the patient awaiting placement in inpatient or outpatient setting: Yes.   Has the patient had a psychiatric consult: Yes.   Survey of unit performed for contraband, proper placement and condition of furniture, tampering with fixtures in bathroom, shower, and each patient room: Yes.  ; Findings: none APPEARANCE/BEHAVIOR calm, cooperative and adequate rapport can be established NEURO ASSESSMENT Orientation: time, place and person Hallucinations: No.None noted (Hallucinations) Speech: Normal Gait: Pt in bed, unable to assess.  RESPIRATORY ASSESSMENT Normal expansion.  Clear to auscultation.  No rales, rhonchi, or wheezing., No chest wall tenderness., No kyphosis or scoliosis. CARDIOVASCULAR ASSESSMENT regular rate and rhythm, S1, S2 normal, no murmur, click, rub or gallop GASTROINTESTINAL ASSESSMENT soft, nontender, BS WNL, no r/g EXTREMITIES normal strength, tone, and muscle mass, no deformities, no erythema, induration, or nodules, no evidence of joint effusion, ROM of all joints is normal, no evidence of joint instability PLAN OF CARE Provide calm/safe environment. Vital signs assessed twice daily. ED BHU Assessment once each 12-hour shift. Collaborate with intake RN daily or as condition indicates. Assure the ED provider has rounded once each shift. Provide and encourage hygiene. Provide redirection as needed. Assess for escalating behavior; address immediately and inform ED provider.  Assess family dynamic and appropriateness for visitation as needed: No.; If necessary, describe findings: Family not present, unable to assess family dynamics.  Educate the patient/family about BHU procedures/visitation: Yes.  ; If necessary, describe findings: Pt is cooperative.

## 2014-12-06 ENCOUNTER — Encounter: Payer: Self-pay | Admitting: *Deleted

## 2014-12-06 ENCOUNTER — Inpatient Hospital Stay
Admit: 2014-12-06 | Discharge: 2014-12-07 | DRG: 885 | Disposition: A | Discharge status: Home / Self Care | Payer: No Typology Code available for payment source | Attending: Psychiatry | Admitting: Psychiatry

## 2014-12-06 DIAGNOSIS — Z818 Family history of other mental and behavioral disorders: Secondary | ICD-10-CM

## 2014-12-06 DIAGNOSIS — Z653 Problems related to other legal circumstances: Secondary | ICD-10-CM

## 2014-12-06 DIAGNOSIS — F1010 Alcohol abuse, uncomplicated: Secondary | ICD-10-CM | POA: Diagnosis present

## 2014-12-06 DIAGNOSIS — F909 Attention-deficit hyperactivity disorder, unspecified type: Secondary | ICD-10-CM | POA: Diagnosis present

## 2014-12-06 DIAGNOSIS — F17210 Nicotine dependence, cigarettes, uncomplicated: Secondary | ICD-10-CM | POA: Diagnosis present

## 2014-12-06 DIAGNOSIS — F819 Developmental disorder of scholastic skills, unspecified: Secondary | ICD-10-CM | POA: Diagnosis present

## 2014-12-06 DIAGNOSIS — F4310 Post-traumatic stress disorder, unspecified: Secondary | ICD-10-CM | POA: Diagnosis present

## 2014-12-06 DIAGNOSIS — R45851 Suicidal ideations: Secondary | ICD-10-CM | POA: Diagnosis present

## 2014-12-06 DIAGNOSIS — F3163 Bipolar disorder, current episode mixed, severe, without psychotic features: Secondary | ICD-10-CM | POA: Diagnosis present

## 2014-12-06 DIAGNOSIS — Z915 Personal history of self-harm: Secondary | ICD-10-CM | POA: Diagnosis not present

## 2014-12-06 DIAGNOSIS — J45909 Unspecified asthma, uncomplicated: Secondary | ICD-10-CM | POA: Diagnosis present

## 2014-12-06 DIAGNOSIS — F410 Panic disorder [episodic paroxysmal anxiety] without agoraphobia: Secondary | ICD-10-CM | POA: Diagnosis present

## 2014-12-06 DIAGNOSIS — Z9889 Other specified postprocedural states: Secondary | ICD-10-CM

## 2014-12-06 DIAGNOSIS — F319 Bipolar disorder, unspecified: Secondary | ICD-10-CM | POA: Diagnosis present

## 2014-12-06 MED ORDER — LORAZEPAM 1 MG PO TABS: 1.0000 mg | ORAL_TABLET | Freq: Every day | ORAL | Status: DC

## 2014-12-06 MED ORDER — ADULT MULTIVITAMIN W/MINERALS CH
1.0000 | ORAL_TABLET | Freq: Every day | ORAL | Status: DC
  Administered 2014-12-07: 1 via ORAL
  Filled 2014-12-06: qty 1

## 2014-12-06 MED ORDER — NICOTINE 10 MG IN INHA: 1.0000 | RESPIRATORY_TRACT | Status: DC | PRN

## 2014-12-06 MED ORDER — LOPERAMIDE HCL 2 MG PO CAPS: 2.0000 mg | ORAL_CAPSULE | ORAL | Status: DC | PRN

## 2014-12-06 MED ORDER — LORAZEPAM 1 MG PO TABS: 1.0000 mg | ORAL_TABLET | Freq: Four times a day (QID) | ORAL | Status: DC | PRN

## 2014-12-06 MED ORDER — LORAZEPAM 1 MG PO TABS: 1.0000 mg | ORAL_TABLET | Freq: Two times a day (BID) | ORAL | Status: DC

## 2014-12-06 MED ORDER — HYDROXYZINE HCL 25 MG PO TABS: 25.0000 mg | ORAL_TABLET | Freq: Four times a day (QID) | ORAL | Status: DC | PRN

## 2014-12-06 MED ORDER — QUETIAPINE FUMARATE 100 MG PO TABS
100.0000 mg | ORAL_TABLET | Freq: Every day | ORAL | Status: DC
Start: 2014-12-06 — End: 2014-12-07
  Administered 2014-12-06: 100 mg via ORAL
  Filled 2014-12-06: qty 1

## 2014-12-06 MED ORDER — LORAZEPAM 1 MG PO TABS: 1.0000 mg | ORAL_TABLET | Freq: Three times a day (TID) | ORAL | Status: DC

## 2014-12-06 MED ORDER — ZIPRASIDONE HCL 20 MG PO CAPS
20.0000 mg | ORAL_CAPSULE | Freq: Two times a day (BID) | ORAL | Status: DC
  Administered 2014-12-06 – 2014-12-07 (×2): 20 mg via ORAL
  Filled 2014-12-06 (×2): qty 1

## 2014-12-06 MED ORDER — ZIPRASIDONE MESYLATE 20 MG IM SOLR: 20.0000 mg | Freq: Once | INTRAMUSCULAR | Status: DC

## 2014-12-06 MED ORDER — ACETAMINOPHEN 325 MG PO TABS: 650.0000 mg | ORAL_TABLET | Freq: Four times a day (QID) | ORAL | Status: DC | PRN

## 2014-12-06 MED ORDER — MAGNESIUM HYDROXIDE 400 MG/5ML PO SUSP: 30.0000 mL | Freq: Every day | ORAL | Status: DC | PRN

## 2014-12-06 MED ORDER — ONDANSETRON 4 MG PO TBDP
4.0000 mg | ORAL_TABLET | Freq: Four times a day (QID) | ORAL | Status: DC | PRN
  Filled 2014-12-06: qty 1

## 2014-12-06 MED ORDER — ALUM & MAG HYDROXIDE-SIMETH 200-200-20 MG/5ML PO SUSP: 30.0000 mL | ORAL | Status: DC | PRN

## 2014-12-06 MED ORDER — LORAZEPAM 1 MG PO TABS
1.0000 mg | ORAL_TABLET | Freq: Four times a day (QID) | ORAL | Status: DC
  Administered 2014-12-06 – 2014-12-07 (×2): 1 mg via ORAL
  Filled 2014-12-06 (×2): qty 1

## 2014-12-06 MED ORDER — VITAMIN B-1 100 MG PO TABS
100.0000 mg | ORAL_TABLET | Freq: Every day | ORAL | Status: DC
  Administered 2014-12-07: 100 mg via ORAL
  Filled 2014-12-06: qty 1

## 2014-12-06 NOTE — ED Notes (Signed)
BEHAVIORAL HEALTH ROUNDING Patient sleeping: No. Patient alert and oriented: yes Behavior appropriate: Yes.  ; If no, describe:  Nutrition and fluids offered: yes Toileting and hygiene offered: Yes  Sitter present: q15 minute observations and security camera monitoring Law enforcement present: Yes  ODS  

## 2014-12-06 NOTE — ED Notes (Signed)
Pt observed lying in bed with the TV on    Appropriate to stimulation  No verbalized needs or concerns at this time  NAD assessed  Continue to monitor

## 2014-12-06 NOTE — ED Notes (Signed)
She is currently taking a shower  

## 2014-12-06 NOTE — ED Notes (Signed)
Pt can be observed lying in bed with her head covered by blankets  - she appears to be slowly rolling back and forth  NAD  Continue to monitor

## 2014-12-06 NOTE — ED Provider Notes (Signed)
-----------------------------------------   7:14 AM on 12/06/2014 -----------------------------------------   Blood pressure 98/48, pulse 75, temperature 98.3 F (36.8 C), temperature source Oral, resp. rate 16, height  (1.651 m), weight 140 lb (63.504 kg), SpO2 98 %.  The patient had no acute events since last update.  Calm and cooperative at this time.  Disposition is pending per Psychiatry/Behavioral Medicine team recommendations.     Irean Hong, MD 12/06/14 [phone]

## 2014-12-06 NOTE — ED Notes (Signed)
ED BHU PLACEMENT JUSTIFICATION Is the patient under IVC or is there intent for IVC: Yes.   Is the patient medically cleared: Yes.   Is there vacancy in the ED BHU: Yes.   Is the population mix appropriate for patient: Yes.   Is the patient awaiting placement in inpatient or outpatient setting: Yes.  [facility] once bed/staff become available  Has the patient had a psychiatric consult: Yes.   Survey of unit performed for contraband, proper placement and condition of furniture, tampering with fixtures in bathroom, shower, and each patient room: Yes.  ; Findings:  APPEARANCE/BEHAVIOR Calm and cooperative NEURO ASSESSMENT Orientation: oriented x3  Denies pain Hallucinations: No.None noted (Hallucinations) Speech: Normal Gait: normal RESPIRATORY ASSESSMENT Even  Unlabored respirations  CARDIOVASCULAR ASSESSMENT Pulses equal   regular rate  Skin warm and dry   GASTROINTESTINAL ASSESSMENT no GI complaint EXTREMITIES Full ROM  PLAN OF CARE Provide calm/safe environment. Vital signs assessed twice daily. ED BHU Assessment once each 12-hour shift. Collaborate with intake RN daily or as condition indicates. Assure the ED provider has rounded once each shift. Provide and encourage hygiene. Provide redirection as needed. Assess for escalating behavior; address immediately and inform ED provider.  Assess family dynamic and appropriateness for visitation as needed: Yes.  ; If necessary, describe findings:  Educate the patient/family about BHU procedures/visitation: Yes.  ; If necessary, describe findings:

## 2014-12-06 NOTE — ED Notes (Signed)
BEHAVIORAL HEALTH ROUNDING  Patient sleeping: Yes.  Patient alert and oriented: Pt is sleeping.  Behavior appropriate: Yes. ; If no, describe: Pt is sleeping.  Nutrition and fluids offered: No  Toileting and hygiene offered: No  Sitter present: yes  Law enforcement present: Yes   

## 2014-12-06 NOTE — Tx Team (Signed)
Initial Interdisciplinary Treatment Plan   PATIENT STRESSORS: Financial difficulties Marital or family conflict Medication change or noncompliance Substance abuse   PATIENT STRENGTHS: Average or above average intelligence Capable of independent living Communication skills General fund of knowledge   PROBLEM LIST: Problem List/Patient Goals Date to be addressed Date deferred Reason deferred Estimated date of resolution  Bipolar Disorder 12/06/14     Suicidal ideation 12/06/14                                                DISCHARGE CRITERIA:  Improved stabilization in mood, thinking, and/or behavior  PRELIMINARY DISCHARGE PLAN: Outpatient therapy  PATIENT/FAMIILY INVOLVEMENT: This treatment plan has been presented to and reviewed with the patient, Danyella Mcginty, and/or family member, .  The patient and family have been given the opportunity to ask questions and make suggestions.  Shelia Media 12/06/2014, 5:56 PM

## 2014-12-06 NOTE — ED Notes (Addendum)
Shower completed  When I asked her to bring her dirty clothes, towel and basin to the door for disposal and storage she stated  "Go get it yourself - Ain't nobody else made me pick it up  - you take your ass in there and pick up after me."  Pt informed NO  She went in and picked up everything and it was sopping wet  - she threw it in the DR by the ns door  She then went into the BR and she brushed her teeth - I asked for her toothbrush and paste back and she gave it to me  Pt stomped back to the door leading to her room  - "Open this damn door - now" - door opened by security  - she tried to slam the door but he prevented it from slamming   By her actions you can tell this made her angry

## 2014-12-06 NOTE — ED Notes (Signed)
Report called to [facility]  Marylu Lund RN   Pt to be admitted  2/2 bags of belongings to transfer to [facility] with her    she denies pain

## 2014-12-06 NOTE — ED Notes (Signed)
ENVIRONMENTAL ASSESSMENT Potentially harmful objects out of patient reach: Yes.   Personal belongings secured: Yes.   Patient dressed in hospital provided attire only: Yes.   Plastic bags out of patient reach: Yes.   Patient care equipment (cords, cables, call bells, lines, and drains) shortened, removed, or accounted for: Yes.   Equipment and supplies removed from bottom of stretcher: Yes.   Potentially toxic materials out of patient reach: Yes.   Sharps container removed or out of patient reach: Yes.     BEHAVIORAL HEALTH ROUNDING Patient sleeping: No. Patient alert and oriented: yes Behavior appropriate: Yes.  ; If no, describe:  Nutrition and fluids offered: yes Toileting and hygiene offered: Yes  Sitter present: q15 minute observations and security camera monitoring Law enforcement present: Yes  ODS  

## 2014-12-06 NOTE — ED Notes (Signed)
BEHAVIORAL HEALTH ROUNDING Patient sleeping: Yes.   Patient alert and oriented: eyes closed  Appears asleep Behavior appropriate: Yes.  ; If no, describe:  Nutrition and fluids offered: Yes  Toileting and hygiene offered: sleeping Sitter present: q 15 minute observations and security camera monitoring Law enforcement present: yes  ODS 

## 2014-12-06 NOTE — ED Notes (Signed)
Supper provided along with an extra drink  - a hamburger was sent - I called dining svcs and reordered her a meal - I ordered a garden burger   Pt appears anxious about her pending admssion to [facility]

## 2014-12-06 NOTE — BHH Counselor (Signed)
Authorization Request for Psych Inpt. Treatment from [employer] completed and submitted. TAR# [id number].

## 2014-12-06 NOTE — ED Notes (Signed)
Breakfast provided   Pt with c/o room being cold  Pt noted to have 5 blankets  Pt informed of psych md to assess progress after lunchtime today  "I don't care - get me another blanket - it is cold as f--k in here."   Temp adjusted

## 2014-12-06 NOTE — ED Provider Notes (Signed)
-----------------------------------------   3:59 PM on 12/06/2014 -----------------------------------------  Patient seen by Dr. Toni Amend. She will be admitted to the psychiatric area of the hospital.  Final diagnosis:  Bipolar disorder.  Darien Ramus, MD 12/06/14 1600

## 2014-12-06 NOTE — ED Notes (Signed)
[employer] delivered a chicken sandwich - pt states "I guess I will eat that shit."

## 2014-12-06 NOTE — BHH Counselor (Signed)
[employer] Enrollment completed and submitted. STR# [id number]

## 2014-12-06 NOTE — Consult Note (Signed)
[facility] Face-to-Face Psychiatry Consult   Reason for Consult:  Consult for this 24 year old woman with a past history of mood instability who comes here on involuntary commitment from [facility] Referring Physician:  Quale Patient Identification: Ashley Bryan MRN:  [id number] Principal Diagnosis: Bipolar disorder, curr episode mixed, severe, w/o psychotic features Diagnosis:   Patient Active Problem List   Diagnosis Date Noted  . Bipolar disorder, curr episode mixed, severe, w/o psychotic features [F31.63] 12/04/2014    Total Time spent with patient: 30 minutes  Subjective:   Ashley Bryan is a 24 y.o. female patient admitted with "I just need to get myself together".  HPI:  History from the patient and the chart. The patient was sent here on commitment petition from [facility] and is accompanied by an evaluation done today by Dr. Lenis Noon at [facility]. Patient presented there with multiple complaints and a lot of emotional lability. She gave him essentially what appears to be the same history and presentation she is showing here. She complains of a large number of severe stresses in her life including legal problems with the fear that she will be sent to jail. Worries a lot about her children. She seems to indicate that she sleeps poorly. Her mood seems to feel upset much of the time. I was not able to get to the point of inquiring about suicidality but she told Dr. Lenis Noon that she had suicidal thoughts today. She denies that she's been abusing any drugs recently. She has not gone for any outpatient treatment since she was last evaluated here 1 year ago. Patient is disorganized in her thinking but it was not possible to pin down specific delusions.  Past psychiatric history: Patient was seen by me here in the emergency room in spring of 2015 and at that time gave a history of long-standing mood lability. She was not committable at that time and was discharged from the emergency room on Seroquel and Lexapro.  Sounds like she has been only intermittently compliant with it and has somehow managed to still have a few around although I don't understand how that happens and she hasn't gone for any treatment in between. She was not able to tell me this time about any history of suicidality or past hospitalizations. I know that she does have a past history of bipolar disorder but also a past history of substance abuse problems.  Medical history: No significant medical problems identified outside of her mental health issues  Social history: Patient has 2 children ages 43 and 4 who live with her. I am unclear as to whether she is living with a partner right now. Patient is gay and has made multiple references to previous girlfriends but I'm not clear how involved they are in her life right now. It sounds like the children are currently in the custody of her grandmother.  Family history: Unknown  Substance abuse history: History of substance abuse problems in the past but says that she's been staying sober recently. Follow-up as of Thursday the 11th. Patient was not violent today but she remains emotionally labile. Basically cooperative but still frequently loud. On interview the patient is tearful and disorganized in her thinking. Not grossly psychotic but unable to really think through a reasonable plan and clearly in great emotional distress. Patient still doesn't really seem to be able to think through what would be most helpful for her. I think she is likely to very much benefit from inpatient hospitalization.  HPI Elements:   Quality:  Mood  instability and agitation. Severity:  Moderate possibly severe. Timing:  Long-standing intermittent problem. Duration:  Still going on here in the emergency room. Context:  Multiple legal problems.  Past Medical History:  Past Medical History  Diagnosis Date  . Ovarian cyst   . Asthma   . Anxiety   . Depression   . Eczema     Past Surgical History  Procedure  Laterality Date  . Hernia repair     Family History: History reviewed. No pertinent family history. Social History:  History  Alcohol Use No     History  Drug Use  . Yes  . Special: Marijuana    Social History   Social History  . Marital Status: Single    Spouse Name: N/A  . Number of Children: N/A  . Years of Education: N/A   Social History Main Topics  . Smoking status: Current Some Day Smoker    Types: Cigarettes  . Smokeless tobacco: None  . Alcohol Use: No  . Drug Use: Yes    Special: Marijuana  . Sexual Activity: Yes    Birth Control/ Protection: None     Comment: homosexual activity   Other Topics Concern  . None   Social History Narrative   Additional Social History:                          Allergies:   Allergies  Allergen Reactions  . Latex Rash  . Tylenol [Acetaminophen] Rash    Makes her ezcema worse    Labs:  No results found for this or any previous visit (from the past 48 hour(s)).  Vitals: Blood pressure 109/69, pulse 82, temperature 98.9 F (37.2 C), temperature source Oral, resp. rate 18, height 5\' 5"  (1.651 m), weight 62.596 kg (138 lb), SpO2 100 %.  Risk to Self: Is patient at risk for suicide?: No Risk to Others:   Prior Inpatient Therapy:   Prior Outpatient Therapy:    Current Facility-Administered Medications  Medication Dose Route Frequency Provider Last Rate Last Dose  . acetaminophen (TYLENOL) tablet 650 mg  650 mg Oral Q6H PRN Audery Amel, MD      . alum & mag hydroxide-simeth (MAALOX/MYLANTA) 200-200-20 MG/5ML suspension 30 mL  30 mL Oral Q4H PRN Audery Amel, MD      . magnesium hydroxide (MILK OF MAGNESIA) suspension 30 mL  30 mL Oral Daily PRN Audery Amel, MD      . nicotine (NICOTROL) 10 MG inhaler 1 continuous puffing  1 continuous puffing Inhalation PRN Audery Amel, MD      . QUEtiapine (SEROQUEL) tablet 100 mg  100 mg Oral QHS Audery Amel, MD      . ziprasidone (GEODON) capsule 20 mg  20 mg  Oral BID WC Audery Amel, MD   20 mg at 12/06/14 1835  . ziprasidone (GEODON) injection 20 mg  20 mg Intramuscular Once Audery Amel, MD   20 mg at 12/06/14 1836    Musculoskeletal: Strength & Muscle Tone: within normal limits Gait & Station: normal Patient leans: N/A  Psychiatric Specialty Exam: Physical Exam  Constitutional: She appears well-developed and well-nourished.  HENT:  Head: Normocephalic and atraumatic.  Eyes: Conjunctivae are normal. Pupils are equal, round, and reactive to light.  Neck: Normal range of motion.  Cardiovascular: Normal heart sounds.   Respiratory: Effort normal.  GI: Soft.  Musculoskeletal: Normal range of motion.  Neurological: She is alert.  Skin: Skin is warm and dry.  Psychiatric: Her affect is labile. Her speech is tangential. She is agitated. Thought content is paranoid. Cognition and memory are normal. She expresses impulsivity and inappropriate judgment. She expresses suicidal ideation.  Patient was intermittently agitated during the day but not violent. This afternoon she was very tearful about going to the psychiatry ward. Still not thinking very lucidly    Review of Systems  Constitutional: Negative.   HENT: Negative.   Eyes: Negative.   Respiratory: Negative.   Cardiovascular: Negative.   Gastrointestinal: Negative.   Musculoskeletal: Negative.   Skin: Negative.   Neurological: Negative.   Psychiatric/Behavioral: Positive for depression and suicidal ideas. Negative for hallucinations and substance abuse. The patient is nervous/anxious and has insomnia.     Blood pressure 109/69, pulse 82, temperature 98.9 F (37.2 C), temperature source Oral, resp. rate 18, height  (1.651 m), weight 62.596 kg (138 lb), SpO2 100 %.Body mass index is 22.96 kg/(m^2).  General Appearance: Fairly Groomed  Patent attorney::  Fair  Speech:  Normal Rate  Volume:  Increased  Mood:  Irritable  Affect:  Labile and Tearful  Thought Process:   Circumstantial and Tangential  Orientation:  Full (Time, Place, and Person)  Thought Content:  Negative  Suicidal Thoughts:  Yes.  with intent/plan  Homicidal Thoughts:  No  Memory:  Immediate;   Fair Recent;   Fair Remote;   Fair  Judgement:  Impaired  Insight:  Lacking  Psychomotor Activity:  Restlessness  Concentration:  Poor  Recall:  [employer] of Knowledge:Good  Language: Fair  Akathisia:  No  Handed:  Right  AIMS (if indicated):     Assets:  Communication Skills Desire for Improvement Physical Health  ADL's:  Intact  Cognition: WNL  Sleep:      Medical Decision Making: Review of Psycho-Social Stressors (1), Review or order clinical lab tests (1), Established Problem, Worsening (2), Review or order medicine tests (1) and Review of Medication Regimen & Side Effects (2)  Treatment Plan Summary: Plan patient is going to be admitted to the psychiatry ward. Orders done. Patient notified. She recognizes that she has a mental health problem and needs help but has been resistant to appropriate treatment. Nonetheless she is not violent and not resisting of admission. Plan is to continue current medication. Reviewed with her the rationale and goals of hospitalization.  Plan:  Recommend psychiatric Inpatient admission when medically cleared. Supportive therapy provided about ongoing stressors. Disposition: Continue monitoring in the emergency room until bed space available  Mordecai Rasmussen 12/06/2014 7:13 PM

## 2014-12-06 NOTE — ED Notes (Signed)
Pt given breakfast tray

## 2014-12-06 NOTE — ED Notes (Signed)
Lunch provided along with an extra drink  - pt states  "I do not eat beef or pork."  I changed the order and called for her a garden burger
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
## 2014-12-06 NOTE — ED Notes (Signed)
Pt refuses to take her am med that is ordered  "Ashley Bryan are just trying to drug me up - I don't need drugs - I want to get the f--k out of here."  Pt informed of md note to admit her to [facility]  TTS reports that the pt will be seen again by psych md prior to discharge

## 2014-12-07 DIAGNOSIS — F3163 Bipolar disorder, current episode mixed, severe, without psychotic features: Principal | ICD-10-CM

## 2014-12-07 MED ORDER — ARIPIPRAZOLE ER 400 MG IM SUSR: 234.0000 mg | INTRAMUSCULAR | Status: DC

## 2014-12-07 MED ORDER — TRAZODONE HCL 100 MG PO TABS: 100.0000 mg | ORAL_TABLET | Freq: Every day | ORAL | Status: AC

## 2014-12-07 MED ORDER — TRAZODONE HCL 100 MG PO TABS: 100.0000 mg | ORAL_TABLET | Freq: Every day | ORAL | Status: DC

## 2014-12-07 MED ORDER — CARBAMAZEPINE 200 MG PO TABS
200.0000 mg | ORAL_TABLET | Freq: Two times a day (BID) | ORAL | Status: DC
  Administered 2014-12-07: 200 mg via ORAL
  Filled 2014-12-07: qty 1

## 2014-12-07 MED ORDER — ARIPIPRAZOLE 10 MG PO TABS: 10.0000 mg | ORAL_TABLET | Freq: Every day | ORAL | Status: DC

## 2014-12-07 MED ORDER — ARIPIPRAZOLE 10 MG PO TABS: 10.0000 mg | ORAL_TABLET | Freq: Every day | ORAL | Status: AC

## 2014-12-07 MED ORDER — ARIPIPRAZOLE ER 400 MG IM SUSR: 234.0000 mg | INTRAMUSCULAR | Status: AC

## 2014-12-07 MED ORDER — CARBAMAZEPINE 200 MG PO TABS: 200.0000 mg | ORAL_TABLET | Freq: Two times a day (BID) | ORAL | Status: AC

## 2014-12-07 MED ORDER — CARBAMAZEPINE 200 MG PO TABS: 200.0000 mg | ORAL_TABLET | Freq: Two times a day (BID) | ORAL | Status: DC

## 2014-12-07 NOTE — H&P (Signed)
Psychiatric Admission Assessment Adult  Patient Identification: Ashley Bryan MRN:  [id number] Date of Evaluation:  12/07/2014 Chief Complaint:  Bipolar Principal Diagnosis: Bipolar disorder, curr episode mixed, severe, w/o psychotic features Diagnosis:   Patient Active Problem List   Diagnosis Date Noted  . Bipolar disorder, curr episode mixed, severe, w/o psychotic features [F31.63] 12/04/2014   History of Present Illness::   Identifying data. Ashley Bryan is a 24 year old female with history of bipolar disorder  Chief complaint. "I easily lose my temper."  History of present illness. Ashley Bryan has a long history of depression, anxiety, mood instability. Until 2010 she was on disability from mental illness. Somehow she lost that benefit along with Medicaid. The patient reports that last time she was treated at [facility] in [place] several years ago. She was seen in the emergency room here and was prescribed Seroquel and Lexapro but did not feel that Seroquel was working. She reports that over several months she has been increasingly depressed with poor sleep, decreased appetite, anhedonia, feeling of guilt or worthlessness or helplessness, poor energy and concentration, crying spells, high anxiety, irritability.  She started thinking of cutting which she did in the past but did not attempt to hurt herself. She has been worried about increased irritability, heightened anxiety and inability to keep her cool. She denies psychotic symptoms or symptoms suggestive of bipolar mania. She reports heightened anxiety with panic attacks and symptoms suggestive of PTSD. She denies illicit drugs or alcohol use recently.   Past psychiatric history. She was diagnosed with bipolar, PTSD, ADHD, panic disorder and learning disability. She denies ever attempting suicide but does have history of cutting. There was a period of time when she was drinking more and using pills but this was related to her  employment is over.  Family psychiatry history. She has an aunt with bipolar and a cousin.   Social history. The patient he leaves in her apartment with 2 of her children. She has support from her grandmother and mother. As she lost disability check in 2010 and is no Medicaid at the moment. She has not been able to maintain employment and recently was dancing and streepping.   Total Time spent with patient: 1 hour  Past Medical History:  Past Medical History  Diagnosis Date  . Ovarian cyst   . Asthma   . Anxiety   . Depression   . Eczema     Past Surgical History  Procedure Laterality Date  . Hernia repair     Family History: History reviewed. No pertinent family history. Social History:  History  Alcohol Use No     History  Drug Use  . Yes  . Special: Marijuana    Social History   Social History  . Marital Status: Single    Spouse Name: N/A  . Number of Children: N/A  . Years of Education: N/A   Social History Main Topics  . Smoking status: Current Some Day Smoker    Types: Cigarettes  . Smokeless tobacco: None  . Alcohol Use: No  . Drug Use: Yes    Special: Marijuana  . Sexual Activity: Yes    Birth Control/ Protection: None     Comment: homosexual activity   Other Topics Concern  . None   Social History Narrative   Additional Social History:                          Musculoskeletal: Strength & Muscle Tone: within  normal limits Gait & Station: normal Patient leans: N/A  Psychiatric Specialty Exam: Physical Exam  Nursing note and vitals reviewed.   Review of Systems  All other systems reviewed and are negative.   Blood pressure 105/73, pulse 77, temperature 98.9 F (37.2 C), temperature source Oral, resp. rate 18, height 5' 5"  (1.651 m), weight 62.596 kg (138 lb), SpO2 100 %.Body mass index is 22.96 kg/(m^2).  See SRA.                                                  Sleep:      Risk to Self: Is patient at  risk for suicide?: No Risk to Others:   Prior Inpatient Therapy:   Prior Outpatient Therapy:    Alcohol Screening: 1. How often do you have a drink containing alcohol?: Never 9. Have you or someone else been injured as a result of your drinking?: No 10. Has a relative or friend or a doctor or another health worker been concerned about your drinking or suggested you cut down?: Yes, during the last year Alcohol Use Disorder Identification Test Final Score (AUDIT): 4 Brief Intervention: AUDIT score less than 7 or less-screening does not suggest unhealthy drinking-brief intervention not indicated  Allergies:   Allergies  Allergen Reactions  . Latex Rash  . Tylenol [Acetaminophen] Rash    Makes her ezcema worse   Lab Results: No results found for this or any previous visit (from the past 48 hour(s)). Current Medications: Current Facility-Administered Medications  Medication Dose Route Frequency Provider Last Rate Last Dose  . acetaminophen (TYLENOL) tablet 650 mg  650 mg Oral Q6H PRN Gonzella Lex, MD      . alum & mag hydroxide-simeth (MAALOX/MYLANTA) 200-200-20 MG/5ML suspension 30 mL  30 mL Oral Q4H PRN Gonzella Lex, MD      . ARIPiprazole (ABILIFY) tablet 10 mg  10 mg Oral QHS Filomeno Cromley B Linder Prajapati, MD      . carbamazepine (TEGRETOL) tablet 200 mg  200 mg Oral BID Dunia Pringle B Ramona Ruark, MD      . hydrOXYzine (ATARAX/VISTARIL) tablet 25 mg  25 mg Oral Q6H PRN Rainey Pines, MD      . magnesium hydroxide (MILK OF MAGNESIA) suspension 30 mL  30 mL Oral Daily PRN Gonzella Lex, MD      . multivitamin with minerals tablet 1 tablet  1 tablet Oral Daily Rainey Pines, MD   1 tablet at 12/07/14 1017  . nicotine (NICOTROL) 10 MG inhaler 1 continuous puffing  1 continuous puffing Inhalation PRN Gonzella Lex, MD      . ondansetron (ZOFRAN-ODT) disintegrating tablet 4 mg  4 mg Oral Q6H PRN Rainey Pines, MD      . QUEtiapine (SEROQUEL) tablet 100 mg  100 mg Oral QHS Gonzella Lex, MD   100 mg at  12/06/14 2024  . thiamine (VITAMIN B-1) tablet 100 mg  100 mg Oral Daily Rainey Pines, MD   100 mg at 12/07/14 1017   PTA Medications: Prescriptions prior to admission  Medication Sig Dispense Refill Last Dose  . HYDROcodone-acetaminophen (NORCO) 5-325 MG per tablet Take 1 tablet by mouth every 4 (four) hours as needed for moderate pain. 10 tablet 0   . sulfamethoxazole-trimethoprim (BACTRIM DS,SEPTRA DS) 800-160 MG per tablet Take 2 tablets by mouth 2 (two) times daily.  40 tablet 0     Previous Psychotropic Medications: Yes   Substance Abuse History in the last 12 months:  Yes.      Consequences of Substance Abuse: Negative  Results for orders placed or performed during the hospital encounter of 12/04/14 (from the past 72 hour(s))  Pregnancy, urine     Status: None   Collection Time: 12/04/14  2:58 PM  Result Value Ref Range   Preg Test, Ur NEGATIVE NEGATIVE  Urinalysis complete, with microscopic     Status: Abnormal   Collection Time: 12/04/14  2:58 PM  Result Value Ref Range   Color, Urine YELLOW (A) YELLOW   APPearance HAZY (A) CLEAR   Glucose, UA NEGATIVE NEGATIVE mg/dL   Bilirubin Urine NEGATIVE NEGATIVE   Ketones, ur NEGATIVE NEGATIVE mg/dL   Specific Gravity, Urine 1.016 1.005 - 1.030   Hgb urine dipstick 1+ (A) NEGATIVE   pH 6.0 5.0 - 8.0   Protein, ur NEGATIVE NEGATIVE mg/dL   Nitrite NEGATIVE NEGATIVE   Leukocytes, UA TRACE (A) NEGATIVE   RBC / HPF 0-5 0 - 5 RBC/hpf   WBC, UA 0-5 0 - 5 WBC/hpf   Bacteria, UA NONE SEEN NONE SEEN   Squamous Epithelial / LPF 0-5 (A) NONE SEEN   Mucous PRESENT   Urine Drug Screen, Qualitative     Status: Abnormal   Collection Time: 12/04/14  2:58 PM  Result Value Ref Range   Tricyclic, Ur Screen NONE DETECTED NONE DETECTED   Amphetamines, Ur Screen NONE DETECTED NONE DETECTED   MDMA (Ecstasy)Ur Screen NONE DETECTED NONE DETECTED   Cocaine Metabolite,Ur [place] NONE DETECTED NONE DETECTED   Opiate, Ur Screen NONE DETECTED NONE  DETECTED   Phencyclidine (PCP) Ur S NONE DETECTED NONE DETECTED   Cannabinoid 50 Ng, Ur [place] POSITIVE (A) NONE DETECTED   Barbiturates, Ur Screen NONE DETECTED NONE DETECTED   Benzodiazepine, Ur Scrn NONE DETECTED NONE DETECTED   Methadone Scn, Ur NONE DETECTED NONE DETECTED    Comment: (NOTE) 794  Tricyclics, urine               Cutoff 1000 ng/mL 200  Amphetamines, urine             Cutoff 1000 ng/mL 300  MDMA (Ecstasy), urine           Cutoff 500 ng/mL 400  Cocaine Metabolite, urine       Cutoff 300 ng/mL 500  Opiate, urine                   Cutoff 300 ng/mL 600  Phencyclidine (PCP), urine      Cutoff 25 ng/mL 700  Cannabinoid, urine              Cutoff 50 ng/mL 800  Barbiturates, urine             Cutoff 200 ng/mL 900  Benzodiazepine, urine           Cutoff 200 ng/mL 1000 Methadone, urine                Cutoff 300 ng/mL 1100 1200 The urine drug screen provides only a preliminary, unconfirmed 1300 analytical test result and should not be used for non-medical 1400 purposes. Clinical consideration and professional judgment should 1500 be applied to any positive drug screen result due to possible 1600 interfering substances. A more specific alternate chemical method 1700 must be used in order to obtain a confirmed analytical result.  [place] /  mass spectrometry (GC/MS) is the preferred 1900 confirmatory method.   Basic metabolic panel     Status: Abnormal   Collection Time: 12/04/14  3:19 PM  Result Value Ref Range   Sodium 139 135 - 145 mmol/L   Potassium 3.8 3.5 - 5.1 mmol/L   Chloride 104 101 - 111 mmol/L   CO2 28 22 - 32 mmol/L   Glucose, Bld 115 (H) 65 - 99 mg/dL   BUN 14 6 - 20 mg/dL   Creatinine, Ser 0.73 0.44 - 1.00 mg/dL   Calcium 8.9 8.9 - 10.3 mg/dL   GFR calc non Af Amer >60 >60 mL/min   GFR calc Af Amer >60 >60 mL/min    Comment: (NOTE) The eGFR has been calculated using the CKD EPI equation. This calculation has not been validated in all clinical  situations. eGFR's persistently <60 mL/min signify possible Chronic Kidney Disease.    Anion gap 7 5 - 15  Salicylate level     Status: None   Collection Time: 12/04/14  3:19 PM  Result Value Ref Range   Salicylate Lvl <7.2 2.8 - 30.0 mg/dL  Acetaminophen level     Status: Abnormal   Collection Time: 12/04/14  3:19 PM  Result Value Ref Range   Acetaminophen (Tylenol), Serum <10 (L) 10 - 30 ug/mL    Comment:        THERAPEUTIC CONCENTRATIONS VARY SIGNIFICANTLY. A RANGE OF 10-30 ug/mL MAY BE AN EFFECTIVE CONCENTRATION FOR MANY PATIENTS. HOWEVER, SOME ARE BEST TREATED AT CONCENTRATIONS OUTSIDE THIS RANGE. ACETAMINOPHEN CONCENTRATIONS >150 ug/mL AT 4 HOURS AFTER INGESTION AND >50 ug/mL AT 12 HOURS AFTER INGESTION ARE OFTEN ASSOCIATED WITH TOXIC REACTIONS.   CBC with Differential     Status: None   Collection Time: 12/04/14  3:19 PM  Result Value Ref Range   WBC 7.0 3.6 - 11.0 K/uL   RBC 4.49 3.80 - 5.20 MIL/uL   Hemoglobin 12.9 12.0 - 16.0 g/dL   HCT 39.9 35.0 - 47.0 %   MCV 88.9 80.0 - 100.0 fL   MCH 28.7 26.0 - 34.0 pg   MCHC 32.3 32.0 - 36.0 g/dL   RDW 12.9 11.5 - 14.5 %   Platelets 230 150 - 440 K/uL   Neutrophils Relative % 70 %   Neutro Abs 4.9 1.4 - 6.5 K/uL   Lymphocytes Relative 21 %   Lymphs Abs 1.5 1.0 - 3.6 K/uL   Monocytes Relative 6 %   Monocytes Absolute 0.4 0.2 - 0.9 K/uL   Eosinophils Relative 2 %   Eosinophils Absolute 0.1 0 - 0.7 K/uL   Basophils Relative 1 %   Basophils Absolute 0.1 0 - 0.1 K/uL  Pregnancy, urine POC     Status: None   Collection Time: 12/04/14  3:21 PM  Result Value Ref Range   Preg Test, Ur NEGATIVE NEGATIVE    Comment:        THE SENSITIVITY OF THIS METHODOLOGY IS >24 mIU/mL     Observation Level/Precautions:  15 minute checks  Laboratory:  CBC Chemistry Profile UDS UA  Psychotherapy:    Medications:    Consultations:    Discharge Concerns:    Estimated LOS:  Other:     Psychological Evaluations: No    Treatment Plan Summary: Daily contact with patient to assess and evaluate symptoms and progress in treatment and Medication management  Medical Decision Making:  New problem, with additional work up planned, Review of Psycho-Social Stressors (1), Review or order clinical lab  tests (1), Review of Medication Regimen & Side Effects (2) and Review of New Medication or Change in Dosage (2)   Ashley Bryan is a 24 year old female female with a history of bipolar disorder admitted for worsening of depression and suicidal ideation without intention or plan in the context of treatment noncompliance.  1. Suicidal ideation. At the patient denies feeling suicidal. She actually admits that she made a false statement at the crisis center as she wanted to get services. She is allowing mother of two small children. She is able to contract for safety.  2. Mood. In the past she was treated with Seroquel and Depakote. She does not feel Seroquel has been helping lately. She discontinued Depakote due to weight gain. She was started on Seroquel and Geodon in the emergency room but the patient is asking if she could be put on injectable anti psychotics as her compliance is poor. This is supported by her mother. The patient has never been treated with in [place] oral Haldol. It is possible that she had Abilify at some point. I will prescribe oral oral Abilify and Abilify Maintena was started in the community if she tolerates it well. We will also start Tegretol for mood stabilization.  3. Alcohol abuse. The patient was placed on see what protocol on admission as she denies heavy drinking. She has no symptoms of withdrawal. Vital signs are stable.  4. Disposition. She will be discharged to home. She will follow up with [facility]. She completed free pharmacy application prior to discharge.   I certify that inpatient services furnished can reasonably be expected to improve the patient's condition.   Dmetrius Ambs 8/12/20162:00 PM He relates okay okay

## 2014-12-07 NOTE — Progress Notes (Signed)
  [facility] Adult Case Management Discharge Plan :  Will you be returning to the same living situation after discharge:  Yes,  Home At discharge, do you have transportation home?: Yes,  cab Do you have the ability to pay for your medications: Yes,  Medication managment clinc  Release of information consent forms completed and in the chart;  Patient's signature needed at discharge.  Patient to Follow up at: Follow-up Information    Follow up with [facility].   Why:  Please go to the walk in clinc Monday- Friday between 9:00am -4:00pm. Please take all your hospital discharge paperwork.    Contact information:   [address]  [place], [place] [address] [phone] or [phone] Fax:[phone]      Patient denies SI/HI: Yes,  Yes    Safety Planning and Suicide Prevention discussed: Yes,  With grandmother and patient   Have you used any form of tobacco in the last 30 days? (Cigarettes, Smokeless Tobacco, Cigars, and/or Pipes): Yes  Has patient been referred to the Quitline?: Patient refused referral  Rondall Allegra MSW, LCSWA  12/07/2014, 3:13 PM

## 2014-12-07 NOTE — [facility] Suicide Risk Assessment (Signed)
[facility] INPATIENT:  Family/Significant Other Suicide Prevention Education  Suicide Prevention Education:  Education Completed; Lilybeth Vien [phone] (grandmother)  has been identified by the patient as the family member/significant other with whom the patient will be residing, and identified as the person(s) who will aid the patient in the event of a mental health crisis (suicidal ideations/suicide attempt).  With written consent from the patient, the family member/significant other has been provided the following suicide prevention education, prior to the and/or following the discharge of the patient.  The suicide prevention education provided includes the following:  Suicide risk factors  Suicide prevention and interventions  National Suicide Hotline telephone number  [facility] assessment telephone number  [facility] Emergency Assistance 911  [facility] and/or Residential Mobile Crisis Unit telephone number  Request made of family/significant other to:  Remove weapons (e.g., guns, rifles, knives), all items previously/currently identified as safety concern.    Remove drugs/medications (over-the-counter, prescriptions, illicit drugs), all items previously/currently identified as a safety concern.  The family member/significant other verbalizes understanding of the suicide prevention education information provided.  The family member/significant other agrees to remove the items of safety concern listed above.  Aizlyn Schifano L Asherah Lavoy MSW, LCSWA  12/07/2014, 3:15 PM
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
## 2014-12-07 NOTE — Progress Notes (Signed)
Patient denies SI/HI, denies A/V hallucinations. Patient verbalizes understanding of discharge instructions, follow up care and prescriptions. Patient given all belongings from personal locker. Patient escorted out by staff, transported by taxi  

## 2014-12-07 NOTE — [facility] Group Notes (Signed)
[facility] Group Notes:  (Nursing/MHT/Case Management/Adjunct)  Date:  12/07/2014  Time:  1:29 PM  Type of Therapy:  Psychoeducational Skills  Participation Level:  Active  Participation Quality:  Appropriate  Affect:  Appropriate  Cognitive:  Appropriate  Insight:  Good  Engagement in Group:  Engaged  Modes of Intervention:  Discussion and Education  Summary of Progress/Problems:  Ashley Bryan 12/07/2014, 1:29 PM
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
## 2014-12-07 NOTE — BHH Suicide Risk Assessment (Signed)
[facility] Discharge Suicide Risk Assessment   Demographic Factors:  Low socioeconomic status  Total Time spent with patient: 1 hour  Musculoskeletal: Strength & Muscle Tone: within normal limits Gait & Station: normal Patient leans: N/A  Psychiatric Specialty Exam: Physical Exam  Nursing note and vitals reviewed.   Review of Systems  All other systems reviewed and are negative.   Blood pressure 105/73, pulse 77, temperature 98.9 F (37.2 C), temperature source Oral, resp. rate 18, height  (1.651 m), weight 62.596 kg (138 lb), SpO2 100 %.Body mass index is 22.96 kg/(m^2).  See SRA.                                                     Have you used any form of tobacco in the last 30 days? (Cigarettes, Smokeless Tobacco, Cigars, and/or Pipes): Yes  Has this patient used any form of tobacco in the last 30 days? (Cigarettes, Smokeless Tobacco, Cigars, and/or Pipes) Yes, A prescription for an FDA-approved tobacco cessation medication was offered at discharge and the patient refused  Mental Status Per Nursing Assessment::   On Admission:  NA  Current Mental Status by Physician: NA  Loss Factors: NA  Historical Factors: Impulsivity  Risk Reduction Factors:   Responsible for children under 68 years of age, Sense of responsibility to family, Living with another person, especially a relative and Positive social support  Continued Clinical Symptoms:  Bipolar Disorder:   Depressive phase  Cognitive Features That Contribute To Risk:  None    Suicide Risk:  Minimal: No identifiable suicidal ideation.  Patients presenting with no risk factors but with morbid ruminations; may be classified as minimal risk based on the severity of the depressive symptoms  Principal Problem: Bipolar disorder, curr episode mixed, severe, w/o psychotic features Discharge Diagnoses:  Patient Active Problem List   Diagnosis Date Noted  . Bipolar disorder, curr episode mixed,  severe, w/o psychotic features [F31.63] 12/04/2014    Follow-up Information    Follow up with [facility].   Contact information:   [address]  [place], [place] [address] Phone:[phone] or [phone] Fax:[phone]      Plan Of Care/Follow-up recommendations:  Activity:  As tolerated. Diet:  Regular. Other:  He will follow-up appointments.  Is patient on multiple antipsychotic therapies at discharge:  No   Has Patient had three or more failed trials of antipsychotic monotherapy by history:  No  Recommended Plan for Multiple Antipsychotic Therapies: NA    Ardelle Haliburton 12/07/2014, 3:00 PM

## 2014-12-07 NOTE — Progress Notes (Signed)
   12/07/14 1500  Clinical Encounter Type  Visited With Patient  Visit Type Spiritual support  Referral From Nurse  Consult/Referral To Chaplain  Spiritual Encounters  Spiritual Needs Prayer  Was asked to meet with patient after spirituality group because patient was concerned about having "demons in and around her." Patient came to the spirituality group but left in the middle.  I was told by her nurse afterwards that she has been discharged and declined to speak to me.  Patient told nurse she "had been saved and cured." Nurse informed me that her doctor was in the process of discharging patient.  [employer] Mukund Weinreb-pager [phone]

## 2014-12-07 NOTE — Progress Notes (Signed)
Patient ID: Ashley Bryan, female   DOB: 1991-04-26, 24 y.o.   MRN: [id number]  Pt was discharged with in 48 hours. PSA was not completed. Pt plans to return home and follow up with [facility].   Daisy Floro Quasim Doyon MSW, LCSWA  12/07/2014 3:20 PM

## 2014-12-07 NOTE — Plan of Care (Signed)
Problem: Ineffective individual coping Goal: LTG: Patient will report a decrease in negative feelings Outcome: Not Progressing Not progressing.  Goal: STG: Pt will be able to identify effective and ineffective STG: Pt will be able to identify effective and ineffective coping patterns  Outcome: Not Progressing Not progressing.  Goal: STG: Patient will remain free from self harm Outcome: Progressing No self harm.  Goal: STG-Increase in ability to manage activities of daily living Outcome: Progressing Able to complete ADL's independently.

## 2014-12-07 NOTE — Discharge Summary (Signed)
Physician Discharge Summary Note  Patient:  Ashley Bryan is an 24 y.o., female MRN:  [id number] DOB:  03-Apr-1991 Patient phone:  [phone] (home)  Patient address:   [place] [address],  Total Time spent with patient: 1 hour  Date of Admission:  12/06/2014 Date of Discharge: 12/07/2014.  Reason for Admission:  Suicidal ideation.  Identifying data. Ms. Adami is a 24 year old female with history of bipolar disorder  Chief complaint. "I easily lose my temper."  History of present illness. Ms. Mahurin has a long history of depression, anxiety, mood instability. Until 2010 she was on disability from mental illness. Somehow she lost that benefit along with Medicaid. The patient reports that last time she was treated at [facility] in [place] several years ago. She was seen in the emergency room here and was prescribed Seroquel and Lexapro but did not feel that Seroquel was working. She reports that over several months she has been increasingly depressed with poor sleep, decreased appetite, anhedonia, feeling of guilt or worthlessness or helplessness, poor energy and concentration, crying spells, high anxiety, irritability. She started thinking of cutting which she did in the past but did not attempt to hurt herself. She has been worried about increased irritability, heightened anxiety and inability to keep her cool. She denies psychotic symptoms or symptoms suggestive of bipolar mania. She reports heightened anxiety with panic attacks and symptoms suggestive of PTSD. She denies illicit drugs or alcohol use recently.   Past psychiatric history. She was diagnosed with bipolar, PTSD, ADHD, panic disorder and learning disability. She denies ever attempting suicide but does have history of cutting. There was a period of time when she was drinking more and using pills but this was related to her employment is over.  Family psychiatry history. She has an aunt with bipolar and a  cousin.   Social history. The patient he leaves in her apartment with 2 of her children. She has support from her grandmother and mother. As she lost disability check in 2010 and is no Medicaid at the moment. She has not been able to maintain employment and recently was dancing and streepping.   Principal Problem: Bipolar disorder, curr episode mixed, severe, w/o psychotic features Discharge Diagnoses: Patient Active Problem List   Diagnosis Date Noted  . Bipolar disorder, curr episode mixed, severe, w/o psychotic features [F31.63] 12/04/2014    Musculoskeletal: Strength & Muscle Tone: within normal limits Gait & Station: normal Patient leans: N/A  Psychiatric Specialty Exam: Physical Exam  Nursing note and vitals reviewed.   Review of Systems  All other systems reviewed and are negative.   Blood pressure 105/73, pulse 77, temperature 98.9 F (37.2 C), temperature source Oral, resp. rate 18, height 5' 5"  (1.651 m), weight 62.596 kg (138 lb), SpO2 100 %.Body mass index is 22.96 kg/(m^2).  See SRA.                                                  Sleep:      Have you used any form of tobacco in the last 30 days? (Cigarettes, Smokeless Tobacco, Cigars, and/or Pipes): Yes  Has this patient used any form of tobacco in the last 30 days? (Cigarettes, Smokeless Tobacco, Cigars, and/or Pipes) Yes, A prescription for an FDA-approved tobacco cessation medication was offered at discharge and the patient refused  Past Medical  History:  Past Medical History  Diagnosis Date  . Ovarian cyst   . Asthma   . Anxiety   . Depression   . Eczema     Past Surgical History  Procedure Laterality Date  . Hernia repair     Family History: History reviewed. No pertinent family history. Social History:  History  Alcohol Use No     History  Drug Use  . Yes  . Special: Marijuana    Social History   Social History  . Marital Status: Single    Spouse Name: N/A  .  Number of Children: N/A  . Years of Education: N/A   Social History Main Topics  . Smoking status: Current Some Day Smoker    Types: Cigarettes  . Smokeless tobacco: None  . Alcohol Use: No  . Drug Use: Yes    Special: Marijuana  . Sexual Activity: Yes    Birth Control/ Protection: None     Comment: homosexual activity   Other Topics Concern  . None   Social History Narrative    Past Psychiatric History: Hospitalizations:  Outpatient Care:  Substance Abuse Care:  Self-Mutilation:  Suicidal Attempts:  Violent Behaviors:   Risk to Self: Is patient at risk for suicide?: No Risk to Others:   Prior Inpatient Therapy:   Prior Outpatient Therapy:    Level of Care:  OP  Hospital Course:    Ms. Basulto is a 24 year old female with a history of bipolar disorder admitted for worsening of depression and suicidal ideation without intention or plan in the context of treatment noncompliance.  1. Suicidal ideation. At the patient denies feeling suicidal. She is able to contract for safety.   2. Mood. In the past she was treated with Seroquel and Depakote. She does not feel Seroquel has been helping lately. She discontinued Depakote due to weight gain. She was offered Abilify 10 mg nightly to be followed by Abilify Maintena monthly injections as she is interested in long lasting injectable antipsychotics to improve compliance. We prescribed oral Abilify. Abilify Maintena would be  started in the community if she tolerates it well. We also started Tegretol for mood stabilization.  3. Alcohol abuse. The patient was placed on see what protocol on admission as she denies heavy drinking. She has no symptoms of withdrawal. Vital signs are stable.  4. Disposition. She was discharged to home. She will follow up with [facility]. She completed free pharmacy application prior to discharge.   Consults:  None  Significant Diagnostic Studies:  None  Discharge Vitals:   Blood pressure 105/73,  pulse 77, temperature 98.9 F (37.2 C), temperature source Oral, resp. rate 18, height 5' 5"  (1.651 m), weight 62.596 kg (138 lb), SpO2 100 %. Body mass index is 22.96 kg/(m^2). Lab Results:   Results for orders placed or performed during the hospital encounter of 12/04/14 (from the past 72 hour(s))  Basic metabolic panel     Status: Abnormal   Collection Time: 12/04/14  3:19 PM  Result Value Ref Range   Sodium 139 135 - 145 mmol/L   Potassium 3.8 3.5 - 5.1 mmol/L   Chloride 104 101 - 111 mmol/L   CO2 28 22 - 32 mmol/L   Glucose, Bld 115 (H) 65 - 99 mg/dL   BUN 14 6 - 20 mg/dL   Creatinine, Ser 0.73 0.44 - 1.00 mg/dL   Calcium 8.9 8.9 - 10.3 mg/dL   GFR calc non Af Amer >60 >60 mL/min   GFR calc  Af Amer >60 >60 mL/min    Comment: (NOTE) The eGFR has been calculated using the CKD EPI equation. This calculation has not been validated in all clinical situations. eGFR's persistently <60 mL/min signify possible Chronic Kidney Disease.    Anion gap 7 5 - 15  Salicylate level     Status: None   Collection Time: 12/04/14  3:19 PM  Result Value Ref Range   Salicylate Lvl <9.5 2.8 - 30.0 mg/dL  Acetaminophen level     Status: Abnormal   Collection Time: 12/04/14  3:19 PM  Result Value Ref Range   Acetaminophen (Tylenol), Serum <10 (L) 10 - 30 ug/mL    Comment:        THERAPEUTIC CONCENTRATIONS VARY SIGNIFICANTLY. A RANGE OF 10-30 ug/mL MAY BE AN EFFECTIVE CONCENTRATION FOR MANY PATIENTS. HOWEVER, SOME ARE BEST TREATED AT CONCENTRATIONS OUTSIDE THIS RANGE. ACETAMINOPHEN CONCENTRATIONS >150 ug/mL AT 4 HOURS AFTER INGESTION AND >50 ug/mL AT 12 HOURS AFTER INGESTION ARE OFTEN ASSOCIATED WITH TOXIC REACTIONS.   CBC with Differential     Status: None   Collection Time: 12/04/14  3:19 PM  Result Value Ref Range   WBC 7.0 3.6 - 11.0 K/uL   RBC 4.49 3.80 - 5.20 MIL/uL   Hemoglobin 12.9 12.0 - 16.0 g/dL   HCT 39.9 35.0 - 47.0 %   MCV 88.9 80.0 - 100.0 fL   MCH 28.7 26.0 - 34.0 pg    MCHC 32.3 32.0 - 36.0 g/dL   RDW 12.9 11.5 - 14.5 %   Platelets 230 150 - 440 K/uL   Neutrophils Relative % 70 %   Neutro Abs 4.9 1.4 - 6.5 K/uL   Lymphocytes Relative 21 %   Lymphs Abs 1.5 1.0 - 3.6 K/uL   Monocytes Relative 6 %   Monocytes Absolute 0.4 0.2 - 0.9 K/uL   Eosinophils Relative 2 %   Eosinophils Absolute 0.1 0 - 0.7 K/uL   Basophils Relative 1 %   Basophils Absolute 0.1 0 - 0.1 K/uL  Pregnancy, urine POC     Status: None   Collection Time: 12/04/14  3:21 PM  Result Value Ref Range   Preg Test, Ur NEGATIVE NEGATIVE    Comment:        THE SENSITIVITY OF THIS METHODOLOGY IS >24 mIU/mL     Physical Findings: AIMS: Facial and Oral Movements Muscles of Facial Expression: None, normal Lips and Perioral Area: None, normal Jaw: None, normal Tongue: None, normal,Extremity Movements Upper (arms, wrists, hands, fingers): None, normal Lower (legs, knees, ankles, toes): None, normal, Trunk Movements Neck, shoulders, hips: None, normal, Overall Severity Severity of abnormal movements (highest score from questions above): None, normal Incapacitation due to abnormal movements: None, normal Patient's awareness of abnormal movements (rate only patient's report): No Awareness, Dental Status Current problems with teeth and/or dentures?: No Does patient usually wear dentures?: No  CIWA:  CIWA-Ar Total: 0 COWS:      See Psychiatric Specialty Exam and Suicide Risk Assessment completed by Attending Physician prior to discharge.  Discharge destination:  Home  Is patient on multiple antipsychotic therapies at discharge:  No   Has Patient had three or more failed trials of antipsychotic monotherapy by history:  No    Recommended Plan for Multiple Antipsychotic Therapies: NA  Discharge Instructions    Diet - low sodium heart healthy    Complete by:  As directed      Increase activity slowly    Complete by:  As directed  Medication List    STOP taking  these medications        HYDROcodone-acetaminophen 5-325 MG per tablet  Commonly known as:  NORCO      TAKE these medications      Indication   ARIPiprazole 10 MG tablet  Commonly known as:  ABILIFY  Take 1 tablet (10 mg total) by mouth at bedtime.   Indication:  Rapidly Alternating Manic-Depressive Psychosis     ARIPiprazole 400 MG Susr  Inject 234 mg into the muscle every 28 (twenty-eight) days.  Start taking on:  12/13/2014   Indication:  bipolar disorder.     carbamazepine 200 MG tablet  Commonly known as:  TEGRETOL  Take 1 tablet (200 mg total) by mouth 2 (two) times daily.   Indication:  Manic-Depression     sulfamethoxazole-trimethoprim 800-160 MG per tablet  Commonly known as:  BACTRIM DS,SEPTRA DS  Take 2 tablets by mouth 2 (two) times daily.   Indication:  Skin and Soft Tissue Infection     traZODone 100 MG tablet  Commonly known as:  DESYREL  Take 1 tablet (100 mg total) by mouth at bedtime.   Indication:  Trouble Sleeping           Follow-up Information    Follow up with [facility].   Contact information:   [address]  [place], [place] [address] [phone] or [phone] Fax:[phone]      Follow-up recommendations:  Activity:  As tolerated. Diet:  Regular. Other:  Keep follow-up appointments.  Comments:    Total Discharge Time:   Signed: Orson Slick 12/07/2014, 3:03 PM

## 2014-12-10 NOTE — Progress Notes (Signed)
AVS H&P Discharge Summary faxed to [facility] for hospital follow-up °

## 2015-01-03 ENCOUNTER — Ambulatory Visit: Payer: Self-pay

## 2015-03-27 ENCOUNTER — Ambulatory Visit: Payer: Self-pay

## 2015-04-09 ENCOUNTER — Emergency Department (HOSPITAL_COMMUNITY)
Admission: EM | Admit: 2015-04-09 | Discharge: 2015-04-09 | Disposition: A | Discharge status: Home / Self Care | Payer: BLUE CROSS/BLUE SHIELD | Attending: Emergency Medicine | Admitting: Emergency Medicine

## 2015-04-09 ENCOUNTER — Encounter (HOSPITAL_COMMUNITY): Payer: Self-pay | Admitting: Emergency Medicine

## 2015-04-09 DIAGNOSIS — Z79899 Other long term (current) drug therapy: Secondary | ICD-10-CM | POA: Diagnosis not present

## 2015-04-09 DIAGNOSIS — F419 Anxiety disorder, unspecified: Secondary | ICD-10-CM | POA: Insufficient documentation

## 2015-04-09 DIAGNOSIS — Z9889 Other specified postprocedural states: Secondary | ICD-10-CM | POA: Diagnosis not present

## 2015-04-09 DIAGNOSIS — F329 Major depressive disorder, single episode, unspecified: Secondary | ICD-10-CM | POA: Insufficient documentation

## 2015-04-09 DIAGNOSIS — Z872 Personal history of diseases of the skin and subcutaneous tissue: Secondary | ICD-10-CM | POA: Diagnosis not present

## 2015-04-09 DIAGNOSIS — Z91040 Latex allergy status: Secondary | ICD-10-CM | POA: Diagnosis not present

## 2015-04-09 DIAGNOSIS — R110 Nausea: Secondary | ICD-10-CM | POA: Diagnosis not present

## 2015-04-09 DIAGNOSIS — F17210 Nicotine dependence, cigarettes, uncomplicated: Secondary | ICD-10-CM | POA: Insufficient documentation

## 2015-04-09 DIAGNOSIS — N764 Abscess of vulva: Secondary | ICD-10-CM | POA: Insufficient documentation

## 2015-04-09 DIAGNOSIS — J45909 Unspecified asthma, uncomplicated: Secondary | ICD-10-CM | POA: Insufficient documentation

## 2015-04-09 MED ORDER — TRAMADOL HCL 50 MG PO TABS: 50.0000 mg | ORAL_TABLET | Freq: Four times a day (QID) | ORAL | Status: DC | PRN

## 2015-04-09 MED ORDER — BUPIVACAINE HCL (PF) 0.25 % IJ SOLN
10.0000 mL | Freq: Once | INTRAMUSCULAR | Status: AC
  Administered 2015-04-09: 10 mL
  Filled 2015-04-09: qty 30

## 2015-04-09 MED ORDER — SULFAMETHOXAZOLE-TRIMETHOPRIM 800-160 MG PO TABS: 1.0000 | ORAL_TABLET | Freq: Two times a day (BID) | ORAL | Status: AC

## 2015-04-09 MED ORDER — SULFAMETHOXAZOLE-TRIMETHOPRIM 800-160 MG PO TABS
1.0000 | ORAL_TABLET | Freq: Once | ORAL | Status: AC
  Administered 2015-04-09: 1 via ORAL
  Filled 2015-04-09: qty 1

## 2015-04-09 NOTE — ED Notes (Signed)
Pt. Stated, Im on house arrest ajnd have to be back by 700pm

## 2015-04-09 NOTE — ED Notes (Signed)
Pt. Requesting something for pain.

## 2015-04-09 NOTE — ED Provider Notes (Signed)
CSN: [id number]     Arrival date & time 04/09/15  1609 History  By signing my name below, I, Ashley Bryan, attest that this documentation has been prepared under the direction and in the presence of Federated Department StoresHanna Patel-Mills, PA-C. Electronically Signed: Octavia HeirArianna Bryan, ED Scribe. 04/09/2015. 6:17 PM.      Chief Complaint  Patient presents with  . Abscess   The history is provided by the patient. No language interpreter was used.   HPI Comments: Ashley Bryan is a 24 y.o. female with recurrent labial abscesses who presents to the Emergency Department complaining of a recurring, intermittent, moderate, gradual worsening abscess onset a one month ago. She has been having associated nausea. Pt has an abscess on her right labia. Pt notes she has had her abscess cut multiple times in the past. Pt has not taken any medication to alleviate the pain. Pt states she went to her doctor last month and they were unable to do anything with her abscess because they were waiting for it to become a boil again. She denies fever.  Past Medical History  Diagnosis Date  . Ovarian cyst   . Asthma   . Anxiety   . Depression   . Eczema    Past Surgical History  Procedure Laterality Date  . Hernia repair     No family history on file. Social History  Substance Use Topics  . Smoking status: Current Some Day Smoker    Types: Cigarettes  . Smokeless tobacco: None  . Alcohol Use: No   OB History    Gravida Para Term Preterm AB TAB SAB Ectopic Multiple Living   6 2   3  3   2      Review of Systems  Constitutional: Negative for fever.  Gastrointestinal: Positive for nausea.  Skin: Positive for wound.  All other systems reviewed and are negative.     Allergies  Latex and Tylenol  Home Medications   Prior to Admission medications   Medication Sig Start Date End Date Taking? Authorizing Provider  ARIPiprazole (ABILIFY) 10 MG tablet Take 1 tablet (10 mg total) by mouth at bedtime. 12/07/14   Shari ProwsJolanta  B Pucilowska, MD  ARIPiprazole 400 MG SUSR Inject 234 mg into the muscle every 28 (twenty-eight) days. 12/13/14   Shari ProwsJolanta B Pucilowska, MD  carbamazepine (TEGRETOL) 200 MG tablet Take 1 tablet (200 mg total) by mouth 2 (two) times daily. 12/07/14   Shari ProwsJolanta B Pucilowska, MD  sulfamethoxazole-trimethoprim (BACTRIM DS,SEPTRA DS) 800-160 MG tablet Take 1 tablet by mouth 2 (two) times daily. 04/09/15 04/16/15  Hughie Melroy Patel-Mills, PA-C  traMADol (ULTRAM) 50 MG tablet Take 1 tablet (50 mg total) by mouth every 6 (six) hours as needed. 04/09/15   Araceli Coufal Patel-Mills, PA-C  traZODone (DESYREL) 100 MG tablet Take 1 tablet (100 mg total) by mouth at bedtime. 12/07/14   Shari ProwsJolanta B Pucilowska, MD   Triage vitals: BP 107/61 mmHg  Pulse 94  Temp(Src) 98.1 F (36.7 C) (Oral)  Resp 16  Ht 5\' 5"  (1.651 m)  Wt 148 lb (67.132 kg)  BMI 24.63 kg/m2  SpO2 100% Physical Exam  Constitutional: She is oriented to person, place, and time. She appears well-developed and well-nourished. No distress.  HENT:  Head: Normocephalic and atraumatic.  Mouth/Throat: Oropharynx is clear and moist.  Eyes: Conjunctivae and EOM are normal.  Neck: Normal range of motion. Neck supple.  Cardiovascular: Normal rate.   Pulmonary/Chest: Effort normal. No respiratory distress.  Abdominal: Soft. She exhibits no distension. There  is no tenderness.  Genitourinary:  Chaperone present during I&D, 2 cm partially fluctuant and indurated mass with no active drainage or surrounding erythema, tenderness and warmth to palpation.  Musculoskeletal: Normal range of motion. She exhibits no edema.  Neurological: She is alert and oriented to person, place, and time. No sensory deficit.  Skin: Skin is warm and dry.  Psychiatric: She has a normal mood and affect. Her behavior is normal.  Nursing note and vitals reviewed.   ED Course  Procedures  DIAGNOSTIC STUDIES: Oxygen Saturation is 100% on RA, normal by my interpretation.  COORDINATION OF CARE:   5:11 PM Discussed treatment plan which includes I&D and antibiotics with pt at bedside and pt agreed to plan.  INCISION AND DRAINAGE PROCEDURE NOTE: Patient identification was confirmed and verbal consent was obtained. This procedure was performed by Hebert Soho, PA-C at 6:12 PM. Site: right labia Sterile procedures observed Needle size: 25 g Anesthetic used (type and amt): Marcaine 0.25% without epi Blade size: 11 Drainage: 1cc Complexity: Complex Packing used: no Site cleaned with saline. Site anesthetized, incision made over site, wound drained and explored loculations, rinsed with copious amounts of normal saline, covered with dry, sterile dressing.  Pt tolerated procedure well without complications.  Instructions for care discussed verbally and pt provided with additional written instructions for homecare and f/u.  Labs Review Labs Reviewed - No data to display  Imaging Review No results found.   EKG Interpretation None      MDM  Patient with skin abscess. Incision and drainage performed in the ED today.  Abscess was not large enough to warrant packing or drain placement. Wound recheck in 2 days. Supportive care and return precautions discussed.  Pt sent home with Bactrim. Discussed following up with women's outpatient clinic for recheck. The patient appears reasonably screened and/or stabilized for discharge and I doubt any other emergent medical condition requiring further screening, evaluation, or treatment in the ED prior to discharge. Medications  bupivacaine (PF) (MARCAINE) 0.25 % injection 10 mL (10 mLs Infiltration Given 04/09/15 1746)  sulfamethoxazole-trimethoprim (BACTRIM DS,SEPTRA DS) 800-160 MG per tablet 1 tablet (1 tablet Oral Given 04/09/15 1818)   Final diagnoses:  Abscess of right genital labia   I personally performed the services described in this documentation, which was scribed in my presence. The recorded information has been reviewed and is  accurate.   Catha Gosselin, PA-C 04/10/15 1516  Raeford Razor, MD 04/16/15 [phone]

## 2015-04-09 NOTE — Discharge Instructions (Signed)
Incision and Drainage Follow-up with women's outpatient clinic. Return for fever, increased swelling, or no improvement in 48 hours. Take antibiotics as prescribed. Incision and drainage is a procedure in which a sac-like structure (cystic structure) is opened and drained. The area to be drained usually contains material such as pus, fluid, or blood.  LET YOUR CAREGIVER KNOW ABOUT:   Allergies to medicine.  Medicines taken, including vitamins, herbs, eyedrops, over-the-counter medicines, and creams.  Use of steroids (by mouth or creams).  Previous problems with anesthetics or numbing medicines.  History of bleeding problems or blood clots.  Previous surgery.  Other health problems, including diabetes and kidney problems.  Possibility of pregnancy, if this applies. RISKS AND COMPLICATIONS  Pain.  Bleeding.  Scarring.  Infection. BEFORE THE PROCEDURE  You may need to have an ultrasound or other imaging tests to see how large or deep your cystic structure is. Blood tests may also be used to determine if you have an infection or how severe the infection is. You may need to have a tetanus shot. PROCEDURE  The affected area is cleaned with a cleaning fluid. The cyst area will then be numbed with a medicine (local anesthetic). A small incision will be made in the cystic structure. A syringe or catheter may be used to drain the contents of the cystic structure, or the contents may be squeezed out. The area will then be flushed with a cleansing solution. After cleansing the area, it is often gently packed with a gauze or another wound dressing. Once it is packed, it will be covered with gauze and tape or some other type of wound dressing. AFTER THE PROCEDURE   Often, you will be allowed to go home right after the procedure.  You may be given antibiotic medicine to prevent or heal an infection.  If the area was packed with gauze or some other wound dressing, you will likely need to come  back in 1 to 2 days to get it removed.  The area should heal in about 14 days.   This information is not intended to replace advice given to you by your health care provider. Make sure you discuss any questions you have with your health care provider.   Document Released: 10/07/2000 Document Revised: 10/13/2011 Document Reviewed: 06/08/2011 Elsevier Interactive Patient Education [employer]2016 Elsevier Inc.

## 2015-04-09 NOTE — ED Notes (Signed)
Pt. Stated, i have a sore down there that keeps coming back.  Pt. Has an abscess on her vaginal area.

## 2015-04-09 NOTE — ED Notes (Signed)
I&D tray set up at bedside.

## 2015-05-05 ENCOUNTER — Encounter: Payer: Self-pay | Admitting: Emergency Medicine

## 2015-05-05 ENCOUNTER — Emergency Department: Payer: BLUE CROSS/BLUE SHIELD

## 2015-05-05 ENCOUNTER — Emergency Department
Admission: EM | Admit: 2015-05-05 | Discharge: 2015-05-05 | Disposition: A | Discharge status: Home / Self Care | Payer: BLUE CROSS/BLUE SHIELD | Attending: Emergency Medicine | Admitting: Emergency Medicine

## 2015-05-05 DIAGNOSIS — Z79899 Other long term (current) drug therapy: Secondary | ICD-10-CM | POA: Insufficient documentation

## 2015-05-05 DIAGNOSIS — F319 Bipolar disorder, unspecified: Secondary | ICD-10-CM | POA: Insufficient documentation

## 2015-05-05 DIAGNOSIS — R51 Headache: Secondary | ICD-10-CM | POA: Insufficient documentation

## 2015-05-05 DIAGNOSIS — Z91040 Latex allergy status: Secondary | ICD-10-CM | POA: Diagnosis not present

## 2015-05-05 DIAGNOSIS — G8929 Other chronic pain: Secondary | ICD-10-CM | POA: Diagnosis not present

## 2015-05-05 DIAGNOSIS — Z3202 Encounter for pregnancy test, result negative: Secondary | ICD-10-CM | POA: Insufficient documentation

## 2015-05-05 DIAGNOSIS — F17210 Nicotine dependence, cigarettes, uncomplicated: Secondary | ICD-10-CM | POA: Diagnosis not present

## 2015-05-05 LAB — BASIC METABOLIC PANEL
Anion gap: 3 — ABNORMAL LOW (ref 5–15)
BUN: 13 mg/dL (ref 6–20)
CHLORIDE: 106 mmol/L (ref 101–111)
CO2: 29 mmol/L (ref 22–32)
CREATININE: 0.78 mg/dL (ref 0.44–1.00)
Calcium: 8.9 mg/dL (ref 8.9–10.3)
GFR calc Af Amer: >60 mL/min (ref >60)
GLUCOSE: 91 mg/dL (ref 65–99)
POTASSIUM: 4.1 mmol/L (ref 3.5–5.1)
Sodium: 138 mmol/L (ref 135–145)

## 2015-05-05 LAB — CBC WITH DIFFERENTIAL/PLATELET
BASOS ABS: 0.1 10*3/uL (ref 0–0.1)
Basophils Relative: 1 %
Eosinophils Absolute: 0.2 10*3/uL (ref 0–0.7)
Eosinophils Relative: 3 %
HEMATOCRIT: 36.6 % (ref 35.0–47.0)
HEMOGLOBIN: 12 g/dL (ref 12.0–16.0)
LYMPHS PCT: 29 %
Lymphs Abs: 1.8 10*3/uL (ref 1.0–3.6)
MCH: 29.3 pg (ref 26.0–34.0)
MCHC: 32.7 g/dL (ref 32.0–36.0)
MCV: 89.4 fL (ref 80.0–100.0)
MONO ABS: 0.4 10*3/uL (ref 0.2–0.9)
MONOS PCT: 7 %
Neutro Abs: 3.7 10*3/uL (ref 1.4–6.5)
Neutrophils Relative %: 60 %
PLATELETS: 197 10*3/uL (ref 150–440)
RBC: 4.09 MIL/uL (ref 3.80–5.20)
RDW: 13.4 % (ref 11.5–14.5)
WBC: 6.2 10*3/uL (ref 3.6–11.0)

## 2015-05-05 LAB — URINALYSIS COMPLETE WITH MICROSCOPIC (ARMC ONLY)
Bilirubin Urine: NEGATIVE
Glucose, UA: NEGATIVE mg/dL
Hgb urine dipstick: NEGATIVE
KETONES UR: NEGATIVE mg/dL
LEUKOCYTES UA: NEGATIVE
Nitrite: NEGATIVE
PH: 7 (ref 5.0–8.0)
PROTEIN: NEGATIVE mg/dL
SPECIFIC GRAVITY, URINE: 1.021 (ref 1.005–1.030)

## 2015-05-05 LAB — POCT PREGNANCY, URINE: PREG TEST UR: NEGATIVE

## 2015-05-05 MED ORDER — INDOMETHACIN 25 MG PO CAPS: 25.0000 mg | ORAL_CAPSULE | Freq: Three times a day (TID) | ORAL | Status: AC

## 2015-05-05 MED ORDER — ETODOLAC 400 MG PO TABS: 400.0000 mg | ORAL_TABLET | Freq: Two times a day (BID) | ORAL | Status: DC

## 2015-05-05 MED ORDER — TRAMADOL HCL 50 MG PO TABS: 50.0000 mg | ORAL_TABLET | Freq: Four times a day (QID) | ORAL | Status: DC | PRN

## 2015-05-05 NOTE — ED Notes (Signed)
Ambulated independently with an easy and steady gait.  NAD

## 2015-05-05 NOTE — Discharge Instructions (Signed)
General Headache Without Cause A headache is pain or discomfort felt around the head or neck area. There are many causes and types of headaches. In some cases, the cause may not be found.  HOME CARE  Managing Pain  Take over-the-counter and prescription medicines only as told by your doctor.  Lie down in a dark, quiet room when you have a headache.  If directed, apply ice to the head and neck area:  Put ice in a plastic bag.  Place a towel between your skin and the bag.  Leave the ice on for 20 minutes, 2-3 times per day.  Use a heating pad or hot shower to apply heat to the head and neck area as told by your doctor.  Keep lights dim if bright lights bother you or make your headaches worse. Eating and Drinking  Eat meals on a regular schedule.  Lessen how much alcohol you drink.  Lessen how much caffeine you drink, or stop drinking caffeine. General Instructions  Keep all follow-up visits as told by your doctor. This is important.  Keep a journal to find out if certain things bring on headaches. For example, write down:  What you eat and drink.  How much sleep you get.  Any change to your diet or medicines.  Relax by getting a massage or doing other relaxing activities.  Lessen stress.  Sit up straight. Do not tighten (tense) your muscles.  Do not use tobacco products. This includes cigarettes, chewing tobacco, or e-cigarettes. If you need help quitting, ask your doctor.  Exercise regularly as told by your doctor.  Get enough sleep. This often means 7-9 hours of sleep. GET HELP IF:  Your symptoms are not helped by medicine.  You have a headache that feels different than the other headaches.  You feel sick to your stomach (nauseous) or you throw up (vomit).  You have a fever. GET HELP RIGHT AWAY IF:   Your headache becomes really bad.  You keep throwing up.  You have a stiff neck.  You have trouble seeing.  You have trouble speaking.  You have  pain in the eye or ear.  Your muscles are weak or you lose muscle control.  You lose your balance or have trouble walking.  You feel like you will pass out (faint) or you pass out.  You have confusion.   This information is not intended to replace advice given to you by your health care provider. Make sure you discuss any questions you have with your health care provider.   Document Released: 01/21/2008 Document Revised: 01/02/2015 Document Reviewed: 08/06/2014 Elsevier Interactive Patient Education 2016 [employer]   Follow-up with [facility] if any continued problems. Obtain primary care doctor for any continued problems. Continue your regular medication on a daily basis. Indocin 25 mg 1 tablet 3 times a day for headache.

## 2015-05-05 NOTE — ED Notes (Signed)
NAD noted at time of D/C. Pt denies questions or concerns. Pt ambulatory to the lobby at this time.  

## 2015-05-05 NOTE — ED Notes (Signed)
Pt states passing out since she was 14 - having "sharp pain in head since yesterday". Afraid she has cancer and wants checked out.

## 2015-05-05 NOTE — ED Provider Notes (Signed)
[facility] Emergency Department Provider Note  ____________________________________________  Time seen: Approximately 1:15 PM  I have reviewed the triage vital signs and the nursing notes.   HISTORY  Chief Complaint Headache   HPI Ashley Bryan is a 25 y.o. female is here with complaint of passing out multiple times that she was 25 years old and today complaining of a sharp pain in her head since yesterday. She denies any previous injuries and has never seen a doctor for any of her complaints in the past. She states she is afraid she has cancer. We checked out. She also volunteers that she had her period twice last spot which is unusual for her. She denies any nausea, vomiting, no visual changes, no dizziness, no difficulty with walking, no photophobia,no chest pain, and no change in hearing. She has not taken any over-the-counter medication for this. She denies any sore throat or ear pain. At the time of her examination she rates her pain as 0/10. In looking through her past medical history she has a history of bipolar disorder and when asked she states she takes her medication only when she feels like she needs it. This is not on a daily basis and is infrequent.   Past Medical History  Diagnosis Date  . Ovarian cyst   . Asthma   . Anxiety   . Depression   . Eczema     Patient Active Problem List   Diagnosis Date Noted  . Bipolar disorder, curr episode mixed, severe, w/o psychotic features (HCC) 12/04/2014    Past Surgical History  Procedure Laterality Date  . Hernia repair      Current Outpatient Rx  Name  Route  Sig  Dispense  Refill  . ARIPiprazole (ABILIFY) 10 MG tablet   Oral   Take 1 tablet (10 mg total) by mouth at bedtime.   30 tablet   0   . ARIPiprazole 400 MG SUSR   Intramuscular   Inject 234 mg into the muscle every 28 (twenty-eight) days.   1 each   0   . carbamazepine (TEGRETOL) 200 MG tablet   Oral   Take 1 tablet (200  mg total) by mouth 2 (two) times daily.   60 tablet   0   . indomethacin (INDOCIN) 25 MG capsule   Oral   Take 1 capsule (25 mg total) by mouth 3 (three) times daily with meals.   15 capsule   0   . traZODone (DESYREL) 100 MG tablet   Oral   Take 1 tablet (100 mg total) by mouth at bedtime.   30 tablet   0     Allergies Latex and Tylenol  History reviewed. No pertinent family history.  Social History Social History  Substance Use Topics  . Smoking status: Current Some Day Smoker    Types: Cigarettes  . Smokeless tobacco: None  . Alcohol Use: No    Review of Systems Constitutional: No fever/chills Eyes: No visual changes. ENT: No sore throat. Cardiovascular: Denies chest pain. Respiratory: Denies shortness of breath. Gastrointestinal: No abdominal pain.  No nausea, no vomiting.  No diarrhea.  No constipation. Genitourinary: Negative for dysuria. Musculoskeletal: Negative for back pain.  Skin: Negative for rash. Neurological: Positive for headaches, no focal weakness or numbness. Psychiatric:Positive for bipolar disorder.  10-point ROS otherwise negative.  ____________________________________________   PHYSICAL EXAM:  VITAL SIGNS: ED Triage Vitals  Enc Vitals Group     BP 05/05/15 1218 107/57 mmHg  Pulse Rate 05/05/15 1218 71     Resp 05/05/15 1218 16     Temp 05/05/15 1218 98.2 F (36.8 C)     Temp src --      SpO2 05/05/15 1218 98 %     Weight 05/05/15 1218 148 lb (67.132 kg)     Height 05/05/15 1218 5\' 5"  (1.651 m)     Head Cir --      Peak Flow --      Pain Score 05/05/15 1219 0     Pain Loc --      Pain Edu? --      Excl. in GC? --     Constitutional: Alert and oriented. Well appearing and in no acute distress. Eyes: Conjunctivae are normal. PERRL. EOMI. Head: Atraumatic.  Nose: No congestion/rhinnorhea. Mouth/Throat: Mucous membranes are moist.  Oropharynx non-erythematous. Neck: No stridor.  Nontender cervical spine posteriorly.  Range of motion is within normal limits pain difficulty. Hematological/Lymphatic/Immunilogical: No cervical lymphadenopathy. Cardiovascular: Normal rate, regular rhythm. Grossly normal heart sounds.  Good peripheral circulation. Respiratory: Normal respiratory effort.  No retractions. Lungs CTAB. Gastrointestinal: Soft and nontender. No distention.  Musculoskeletal: Moves all extremities with out any difficulty. Patient is ambulatory in the ER without difficulty or assistance. Neurologic:  Normal speech and language. No gross focal neurologic deficits are appreciated. No gait instability. Skin:  Skin is warm, dry and intact. No rash noted. Psychiatric: Mood and affect are normal. Speech and behavior are normal.  ____________________________________________   LABS (all labs ordered are listed, but only abnormal results are displayed)  Labs Reviewed  URINALYSIS COMPLETEWITH MICROSCOPIC ([facility] ONLY) - Abnormal; Notable for the following:    Color, Urine YELLOW (*)    APPearance CLOUDY (*)    Bacteria, UA RARE (*)    Squamous Epithelial / LPF 0-5 (*)    All other components within normal limits  BASIC METABOLIC PANEL - Abnormal; Notable for the following:    Anion gap 3 (*)    All other components within normal limits  CBC WITH DIFFERENTIAL/PLATELET  POC URINE PREG, ED  POCT PREGNANCY, URINE   RADIOLOGY  CT head per radiologist was negative ____________________________________________   PROCEDURES  Procedure(s) performed: None  Critical Care performed: No  ____________________________________________   INITIAL IMPRESSION / ASSESSMENT AND PLAN / ED COURSE  Pertinent labs & imaging results that were available during my care of the patient were reviewed by me and considered in my medical decision making (see chart for details).  Patient was informed of her lab results as well as her CT scanned. Patient is encouraged to take her medication on a daily basis instead of when she  feels like she needs it. [facility] if any continued problems. ____________________________________________   FINAL CLINICAL IMPRESSION(S) / ED DIAGNOSES  Final diagnoses:  Chronic nonintractable headache, unspecified headache type      Tommi RumpsRhonda L Summers, PA-C 05/05/15 1614  Jene Everyobert Kinner, MD 05/06/15 1426

## 2015-05-30 ENCOUNTER — Ambulatory Visit: Payer: No Typology Code available for payment source

## 2015-10-30 ENCOUNTER — Emergency Department
Admission: EM | Admit: 2015-10-30 | Discharge: 2015-10-30 | Disposition: A | Discharge status: Home / Self Care | Payer: BLUE CROSS/BLUE SHIELD | Attending: Emergency Medicine | Admitting: Emergency Medicine

## 2015-10-30 ENCOUNTER — Encounter: Payer: Self-pay | Admitting: Emergency Medicine

## 2015-10-30 ENCOUNTER — Emergency Department: Payer: BLUE CROSS/BLUE SHIELD

## 2015-10-30 DIAGNOSIS — Z3A01 Less than 8 weeks gestation of pregnancy: Secondary | ICD-10-CM | POA: Diagnosis not present

## 2015-10-30 DIAGNOSIS — F17210 Nicotine dependence, cigarettes, uncomplicated: Secondary | ICD-10-CM | POA: Diagnosis not present

## 2015-10-30 DIAGNOSIS — Z791 Long term (current) use of non-steroidal anti-inflammatories (NSAID): Secondary | ICD-10-CM | POA: Diagnosis not present

## 2015-10-30 DIAGNOSIS — O200 Threatened abortion: Secondary | ICD-10-CM | POA: Insufficient documentation

## 2015-10-30 DIAGNOSIS — Z79899 Other long term (current) drug therapy: Secondary | ICD-10-CM | POA: Insufficient documentation

## 2015-10-30 DIAGNOSIS — F314 Bipolar disorder, current episode depressed, severe, without psychotic features: Secondary | ICD-10-CM | POA: Diagnosis not present

## 2015-10-30 DIAGNOSIS — J45909 Unspecified asthma, uncomplicated: Secondary | ICD-10-CM | POA: Diagnosis not present

## 2015-10-30 DIAGNOSIS — O209 Hemorrhage in early pregnancy, unspecified: Secondary | ICD-10-CM | POA: Diagnosis present

## 2015-10-30 DIAGNOSIS — F1290 Cannabis use, unspecified, uncomplicated: Secondary | ICD-10-CM | POA: Insufficient documentation

## 2015-10-30 DIAGNOSIS — N939 Abnormal uterine and vaginal bleeding, unspecified: Secondary | ICD-10-CM

## 2015-10-30 LAB — CBC WITH DIFFERENTIAL/PLATELET
Basophils Absolute: 0.1 10*3/uL (ref 0–0.1)
Basophils Relative: 1 %
EOS ABS: 0.2 10*3/uL (ref 0–0.7)
EOS PCT: 4 %
HCT: 35.4 % (ref 35.0–47.0)
Hemoglobin: 12 g/dL (ref 12.0–16.0)
LYMPHS ABS: 1.9 10*3/uL (ref 1.0–3.6)
Lymphocytes Relative: 32 %
MCH: 29.6 pg (ref 26.0–34.0)
MCHC: 34.1 g/dL (ref 32.0–36.0)
MCV: 87 fL (ref 80.0–100.0)
MONO ABS: 0.4 10*3/uL (ref 0.2–0.9)
MONOS PCT: 7 %
Neutro Abs: 3.4 10*3/uL (ref 1.4–6.5)
Neutrophils Relative %: 56 %
PLATELETS: 193 10*3/uL (ref 150–440)
RBC: 4.07 MIL/uL (ref 3.80–5.20)
RDW: 12.6 % (ref 11.5–14.5)
WBC: 6.1 10*3/uL (ref 3.6–11.0)

## 2015-10-30 LAB — COMPREHENSIVE METABOLIC PANEL
ALT: 9 U/L — ABNORMAL LOW (ref 14–54)
ANION GAP: 5 (ref 5–15)
AST: 14 U/L — ABNORMAL LOW (ref 15–41)
Albumin: 4 g/dL (ref 3.5–5.0)
Alkaline Phosphatase: 50 U/L (ref 38–126)
BUN: 16 mg/dL (ref 6–20)
CALCIUM: 8.8 mg/dL — AB (ref 8.9–10.3)
CHLORIDE: 105 mmol/L (ref 101–111)
CO2: 26 mmol/L (ref 22–32)
Creatinine, Ser: 0.85 mg/dL (ref 0.44–1.00)
Glucose, Bld: 96 mg/dL (ref 65–99)
Potassium: 3.8 mmol/L (ref 3.5–5.1)
SODIUM: 136 mmol/L (ref 135–145)
Total Bilirubin: 0.4 mg/dL (ref 0.3–1.2)
Total Protein: 6.9 g/dL (ref 6.5–8.1)

## 2015-10-30 LAB — WET PREP, GENITAL
CLUE CELLS WET PREP: NONE SEEN
SPERM: NONE SEEN
TRICH WET PREP: NONE SEEN
YEAST WET PREP: NONE SEEN

## 2015-10-30 LAB — CHLAMYDIA/NGC RT PCR (ARMC ONLY)
Chlamydia Tr: NOT DETECTED
N gonorrhoeae: NOT DETECTED

## 2015-10-30 LAB — TYPE AND SCREEN
ABO/RH(D): A POS
Antibody Screen: NEGATIVE

## 2015-10-30 LAB — HCG, QUANTITATIVE, PREGNANCY: HCG, BETA CHAIN, QUANT, S: 6597 m[IU]/mL — AB (ref <5)

## 2015-10-30 MED ORDER — TRAMADOL HCL 50 MG PO TABS
50.0000 mg | ORAL_TABLET | Freq: Once | ORAL | Status: AC
  Administered 2015-10-30: 50 mg via ORAL
  Filled 2015-10-30: qty 1

## 2015-10-30 NOTE — ED Notes (Addendum)
Pt ambulatory to triage via POV c/o vaginal bleeding that started approx 30 minutes ago and reports pregnancy ([facility]) that's approx [redacted] weeks along.  Pt reports two children at home, 2 spontaneous abortions and 1 planned abortion.  Pt reports painful cramping (9/10) in vaginal area.

## 2015-10-30 NOTE — ED Notes (Signed)
Discharge instructions reviewed with patient. Patient verbalized understanding. Patient ambulated to lobby without difficulty.   

## 2015-10-30 NOTE — ED Notes (Signed)
This RN went in to give pt warm blanket, warm wipes, mesh panties and pad so that she could get cleaned up after pelvic exam. Upon entering room, pt asked this RN if I could escort the visitor in the room out. Visitor then stated, "That's fine I can escort myself out" then started to leave the room. Pt then told the visitor, "Give me my keys" and which point visitor said, "They're not your keys, they're my fucking keys." After some arguing, visitor tossed keys to patient. Visitor then left room for a minute but then came back in and sat down and said, "I'm not leaving, you'll have to get the police to escort me out because I have a right to be here." Pt stated, "No, you have to go I don't want you here, I'm bleeding and the stress isn't good for the baby." Visitor then stated, "I'm not leaving, you'll have to call the police to have me escorted out." This RN called [employer] officer from the lobby to have visitor escorted out. When officer came into room, visitor stated "It's my right to be here." Officer explained that visitation is not a right, it's a privilege and if the patient would like her to leave, that she would be escorted out. Patient then said, "She has one more time, she needs to change her fucking attitude." Pt provided with call bell and told that if she changed to her mind to let this RN know.

## 2015-10-30 NOTE — ED Provider Notes (Signed)
[facility] Emergency Department Provider Note   ____________________________________________  Time seen: Approximately 102 AM  I have reviewed the triage vital signs and the nursing notes.   HISTORY  Chief Complaint Vaginal Bleeding and Possible Pregnancy    HPI Ashley Bryan is a 25 y.o. female who comes into the hospital today with vaginal bleeding. The patient reports that she has body aches and she is unsure how long she has been pregnant. Her last menstrual period is June and she reports that she was told at [facility] 2 days ago that she was pregnant 3-4 weeks. She reports that she been seen at [facility] for pain but reports that she wasn't bleeding. She reports that she did not have an ultrasound and she did not take anything for pain. She reports that her arms were aching in her body aches. The patient rates her pain a 9 out of 10 in intensity. She is unsure how many pregnancies that she's had but thinks it's been 5 or 6 and she reports that she's had 3 miscarriages with an abortion as well as a late term abortion. The patient has 2 children at home. She reports that she went to the bathroom and was pouring out blood. The patient is here for evaluation of her symptoms.   Past Medical History  Diagnosis Date  . Ovarian cyst   . Asthma   . Anxiety   . Depression   . Eczema     Patient Active Problem List   Diagnosis Date Noted  . Bipolar disorder, curr episode mixed, severe, w/o psychotic features (HCC) 12/04/2014    Past Surgical History  Procedure Laterality Date  . Hernia repair      Current Outpatient Rx  Name  Route  Sig  Dispense  Refill  . ARIPiprazole (ABILIFY) 10 MG tablet   Oral   Take 1 tablet (10 mg total) by mouth at bedtime.   30 tablet   0   . ARIPiprazole 400 MG SUSR   Intramuscular   Inject 234 mg into the muscle every 28 (twenty-eight) days.   1 each   0   . carbamazepine (TEGRETOL) 200 MG tablet   Oral   Take 1  tablet (200 mg total) by mouth 2 (two) times daily.   60 tablet   0   . indomethacin (INDOCIN) 25 MG capsule   Oral   Take 1 capsule (25 mg total) by mouth 3 (three) times daily with meals.   15 capsule   0   . traZODone (DESYREL) 100 MG tablet   Oral   Take 1 tablet (100 mg total) by mouth at bedtime.   30 tablet   0     Allergies Latex and Tylenol  History reviewed. No pertinent family history.  Social History Social History  Substance Use Topics  . Smoking status: Current Some Day Smoker    Types: Cigarettes  . Smokeless tobacco: None  . Alcohol Use: No    Review of Systems Constitutional: No fever/chills Eyes: No visual changes. ENT: No sore throat. Cardiovascular: Denies chest pain. Respiratory: Denies shortness of breath. Gastrointestinal:  abdominal pain.  No nausea, no vomiting.  No diarrhea.  No constipation. Genitourinary: Vaginal bleeding Musculoskeletal: Body aches Skin: Negative for rash. Neurological: Negative for headaches, focal weakness or numbness.  10-point ROS otherwise negative.  ____________________________________________   PHYSICAL EXAM:  VITAL SIGNS: ED Triage Vitals  Enc Vitals Group     BP 10/30/15 0014 101/58 mmHg  Pulse Rate 10/30/15 0014 87     Resp 10/30/15 0014 18     Temp 10/30/15 0014 98 F (36.7 C)     Temp Source 10/30/15 0014 Oral     SpO2 10/30/15 0014 100 %     Weight 10/30/15 0014 156 lb 8 oz (70.988 kg)     Height 10/30/15 0014 5\' 4"  (1.626 m)     Head Cir --      Peak Flow --      Pain Score 10/30/15 0035 9     Pain Loc --      Pain Edu? --      Excl. in GC? --     Constitutional: Alert and oriented. Well appearing and in Mild distress. Eyes: Conjunctivae are normal. PERRL. EOMI. Head: Atraumatic. Nose: No congestion/rhinnorhea. Mouth/Throat: Mucous membranes are moist.  Oropharynx non-erythematous. Cardiovascular: Normal rate, regular rhythm. Grossly normal heart sounds.  Good peripheral  circulation. Respiratory: Normal respiratory effort.  No retractions. Lungs CTAB. Gastrointestinal: Soft with some mild lower abdominal tenderness to palpation. No distention. Positive bowel sounds Genitourinary: Normal external genitalia with some mild blood in the vaginal vault noted no cervical motion tenderness, mild uterine tenderness to palpation no discharge. Musculoskeletal: No lower extremity tenderness nor edema.  Neurologic:  Normal speech and language.  Skin:  Skin is warm, dry and intact.  Psychiatric: Mood and affect are normal.   ____________________________________________   LABS (all labs ordered are listed, but only abnormal results are displayed)  Labs Reviewed  WET PREP, GENITAL - Abnormal; Notable for the following:    WBC, Wet Prep HPF POC FEW (*)    All other components within normal limits  HCG, QUANTITATIVE, PREGNANCY - Abnormal; Notable for the following:    hCG, Beta Chain, Quant, S 6597 (*)    All other components within normal limits  COMPREHENSIVE METABOLIC PANEL - Abnormal; Notable for the following:    Calcium 8.8 (*)    AST 14 (*)    ALT 9 (*)    All other components within normal limits  CHLAMYDIA/NGC RT PCR ([facility] ONLY)  CBC WITH DIFFERENTIAL/PLATELET  TYPE AND SCREEN   ____________________________________________  EKG  None ____________________________________________  RADIOLOGY  US pelvis: Probable early intrauterine gestational and yolk without fetal pole Or cardiac activity yet visualized, small subchorionic hemorrhage, recommend follow-up quantitative beta hCG and follow-up ultrasound in 14 days to confirm and assess viability. ____________________________________________   PROCEDURES  Procedure(s) performed: None  Procedures  Critical Care performed: No  ____________________________________________   INITIAL IMPRESSION / ASSESSMENT AND PLAN / ED COURSE  Pertinent labs & imaging results that were available during  my care of the patient were reviewed by me and considered in my medical decision making (see chart for details).  This is a 25 year old female who comes into the hospital today with some vaginal bleeding and abdominal pain. The patient's also having some body aches. She reports that she is about 3-[redacted] weeks pregnant. We will check the patient's blood work as well as an ultrasound and reassess the patient.  The patient's wet prep is negative. She is having some bleeding but it is not severe. I will discharge the patient home and have her follow-up with OB/GYN for further evaluation. ____________________________________________   FINAL CLINICAL IMPRESSION(S) / ED DIAGNOSES  Final diagnoses:  Vaginal bleeding  Threatened miscarriage      NEW MEDICATIONS STARTED DURING THIS VISIT:  Discharge Medication List as of 10/30/2015  3:11 AM  Note:  This document was prepared using Dragon voice recognition software and may include unintentional dictation errors.    Rebecka ApleyAllison P Webster, MD 10/30/15 [phone]

## 2015-10-30 NOTE — Discharge Instructions (Signed)
Threatened Miscarriage °A threatened miscarriage occurs when you have vaginal bleeding during your first 20 weeks of pregnancy but the pregnancy has not ended. If you have vaginal bleeding during this time, your health care provider will do tests to make sure you are still pregnant. If the tests show you are still pregnant and the developing baby (fetus) inside your womb (uterus) is still growing, your condition is considered a threatened miscarriage. °A threatened miscarriage does not mean your pregnancy will end, but it does increase the risk of losing your pregnancy (complete miscarriage). °CAUSES  °The cause of a threatened miscarriage is usually not known. If you go on to have a complete miscarriage, the most common cause is an abnormal number of chromosomes in the developing baby. Chromosomes are the structures inside cells that hold all your genetic material. °Some causes of vaginal bleeding that do not result in miscarriage include: °· Having sex. °· Having an infection. °· Normal hormone changes of pregnancy. °· Bleeding that occurs when an egg implants in your uterus. °RISK FACTORS °Risk factors for bleeding in early pregnancy include: °· Obesity. °· Smoking. °· Drinking excessive amounts of alcohol or caffeine. °· Recreational drug use. °SIGNS AND SYMPTOMS °· Light vaginal bleeding. °· Mild abdominal pain or cramps. °DIAGNOSIS  °If you have bleeding with or without abdominal pain before 20 weeks of pregnancy, your health care provider will do tests to check whether you are still pregnant. One important test involves using sound waves and a computer (ultrasound) to create images of the inside of your uterus. Other tests include an internal exam of your vagina and uterus (pelvic exam) and measurement of your baby's heart rate.  °You may be diagnosed with a threatened miscarriage if: °· Ultrasound testing shows you are still pregnant. °· Your baby's heart rate is strong. °· A pelvic exam shows that the  opening between your uterus and your vagina (cervix) is closed. °· Your heart rate and blood pressure are stable. °· Blood tests confirm you are still pregnant. °TREATMENT  °No treatments have been shown to prevent a threatened miscarriage from going on to a complete miscarriage. However, the right home care is important.  °HOME CARE INSTRUCTIONS  °· Make sure you keep all your appointments for prenatal care. This is very important. °· Get plenty of rest. °· Do not have sex or use tampons if you have vaginal bleeding. °· Do not douche. °· Do not smoke or use recreational drugs. °· Do not drink alcohol. °· Avoid caffeine. °SEEK MEDICAL CARE IF: °· You have light vaginal bleeding or spotting while pregnant. °· You have abdominal pain or cramping. °· You have a fever. °SEEK IMMEDIATE MEDICAL CARE IF: °· You have heavy vaginal bleeding. °· You have blood clots coming from your vagina. °· You have severe low back pain or abdominal cramps. °· You have fever, chills, and severe abdominal pain. °MAKE SURE YOU: °· Understand these instructions. °· Will watch your condition. °· Will get help right away if you are not doing well or get worse. °  °This information is not intended to replace advice given to you by your health care provider. Make sure you discuss any questions you have with your health care provider. °  °Document Released: 04/13/2005 Document Revised: 04/18/2013 Document Reviewed: 02/07/2013 °Elsevier Interactive Patient Education ©2016 [employer] ° °Vaginal Bleeding During Pregnancy, First Trimester °A small amount of bleeding (spotting) from the vagina is relatively common in early pregnancy. It usually stops on its own.   Various things may cause bleeding or spotting in early pregnancy. Some bleeding may be related to the pregnancy, and some may not. In most cases, the bleeding is normal and is not a problem. However, bleeding can also be a sign of something serious. Be sure to tell your health care provider  about any vaginal bleeding right away. °Some possible causes of vaginal bleeding during the first trimester include: °· Infection or inflammation of the cervix. °· Growths (polyps) on the cervix. °· Miscarriage or threatened miscarriage. °· Pregnancy tissue has developed outside of the uterus and in a fallopian tube (tubal pregnancy). °· Tiny cysts have developed in the uterus instead of pregnancy tissue (molar pregnancy). °HOME CARE INSTRUCTIONS  °Watch your condition for any changes. The following actions may help to lessen any discomfort you are feeling: °· Follow your health care provider's instructions for limiting your activity. If your health care provider orders bed rest, you may need to stay in bed and only get up to use the bathroom. However, your health care provider may allow you to continue light activity. °· If needed, make plans for someone to help with your regular activities and responsibilities while you are on bed rest. °· Keep track of the number of pads you use each day, how often you change pads, and how soaked (saturated) they are. Write this down. °· Do not use tampons. Do not douche. °· Do not have sexual intercourse or orgasms until approved by your health care provider. °· If you pass any tissue from your vagina, save the tissue so you can show it to your health care provider. °· Only take over-the-counter or prescription medicines as directed by your health care provider. °· Do not take aspirin because it can make you bleed. °· Keep all follow-up appointments as directed by your health care provider. °SEEK MEDICAL CARE IF: °· You have any vaginal bleeding during any part of your pregnancy. °· You have cramps or labor pains. °· You have a fever, not controlled by medicine. °SEEK IMMEDIATE MEDICAL CARE IF:  °· You have severe cramps in your back or belly (abdomen). °· You pass large clots or tissue from your vagina. °· Your bleeding increases. °· You feel light-headed or weak, or you have  fainting episodes. °· You have chills. °· You are leaking fluid or have a gush of fluid from your vagina. °· You pass out while having a bowel movement. °MAKE SURE YOU: °· Understand these instructions. °· Will watch your condition. °· Will get help right away if you are not doing well or get worse. °  °This information is not intended to replace advice given to you by your health care provider. Make sure you discuss any questions you have with your health care provider. °  °Document Released: 01/21/2005 Document Revised: 04/18/2013 Document Reviewed: 12/19/2012 °Elsevier Interactive Patient Education ©2016 [employer] ° °

## 2017-04-21 ENCOUNTER — Telehealth: Payer: Self-pay | Admitting: Pharmacy Technician

## 2017-04-21 NOTE — Telephone Encounter (Signed)
Pt eligible to receive pharmacy benefits with [employer].  [facility] no longer able to provide medication assistance.  Sherilyn DacostaBetty J. Symon [place] Care [facility]

## 2018-01-27 IMAGING — US US OB TRANSVAGINAL
1 series · 14 of 28 positions shown · non-contrast
Comparison: None.

CLINICAL DATA: Vaginal bleeding for 1 day. G6 P2. Gestational age
by last menstrual period 4 weeks and 5 days. Beta HCG 6,597

EXAM:
OBSTETRIC <14 WK US AND TRANSVAGINAL OB US
TECHNIQUE: Both transabdominal and transvaginal ultrasound examinations were
performed for complete evaluation of the gestation as well as the
maternal uterus, adnexal regions, and pelvic cul-de-sac.
Transvaginal technique was performed to assess early pregnancy.

[Series 1: us ob transvaginal · 0.20mm/px · 14 of 55 slices shown]
[im 3/55]
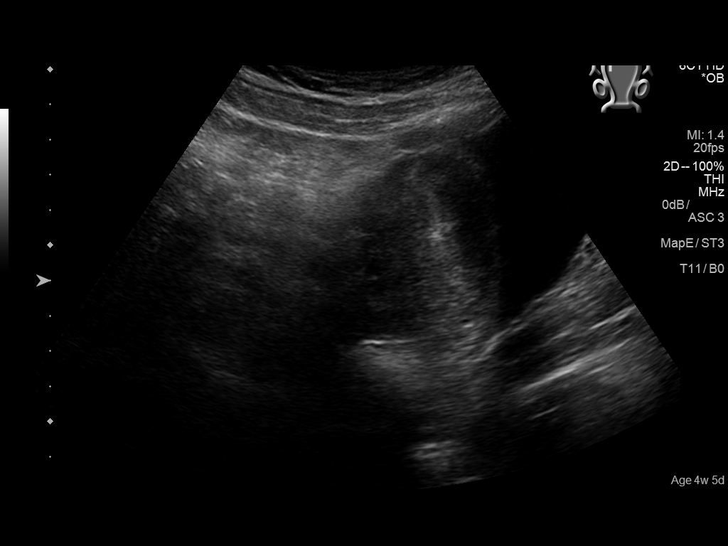
[im 7/55]
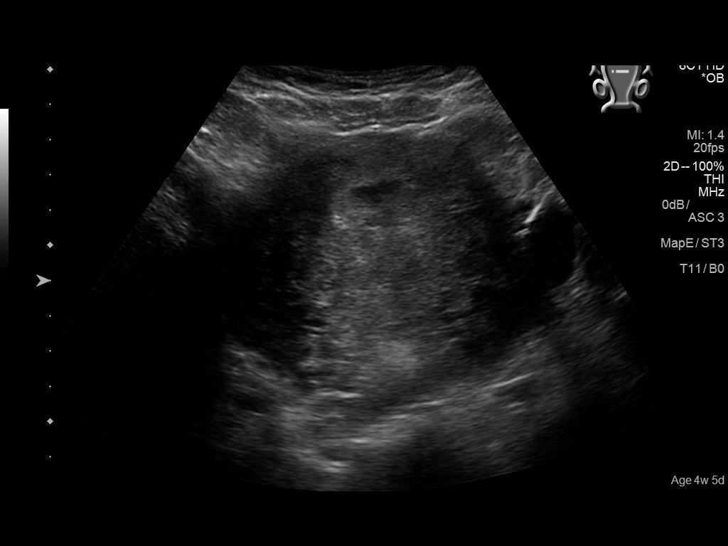
[im 11/55]
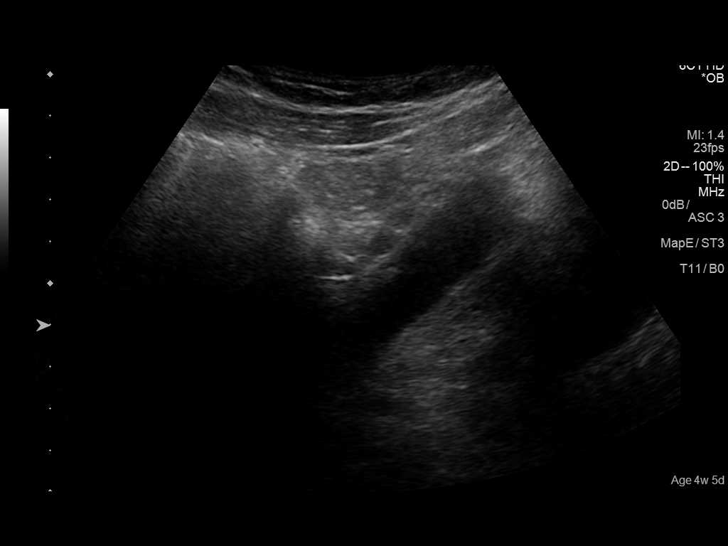
[im 15/55]
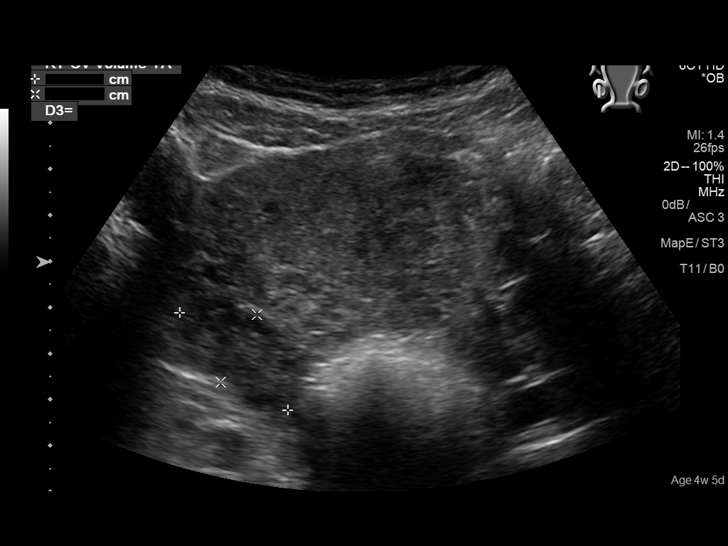
[im 19/55]
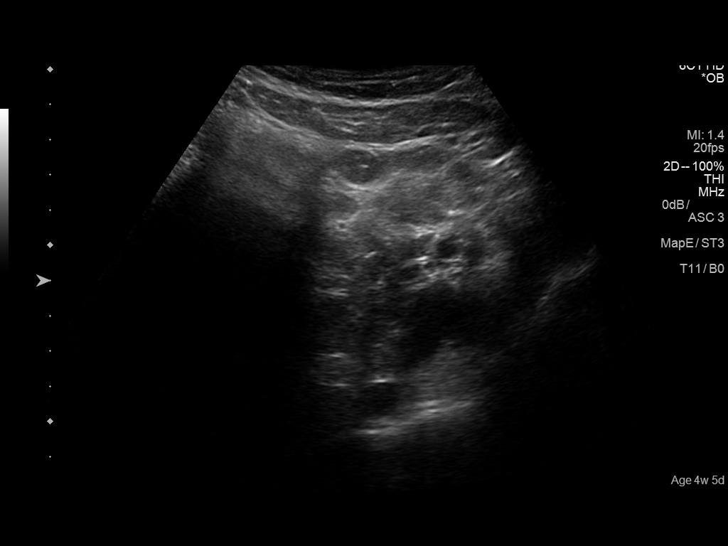
[im 23/55]
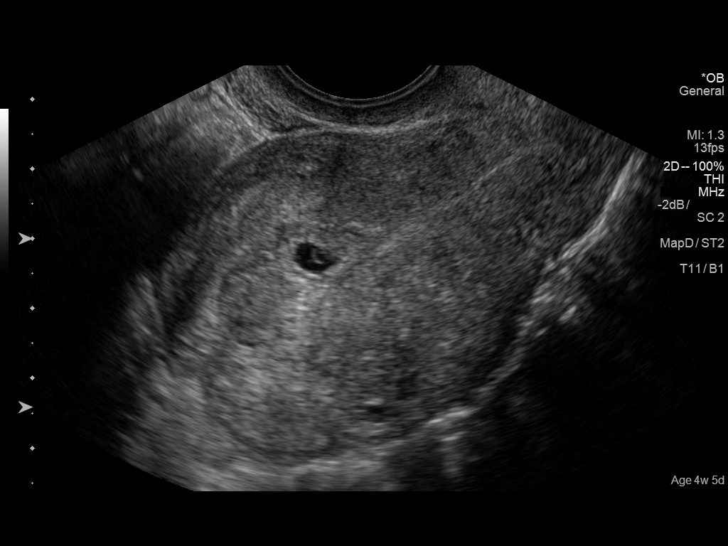
[im 27/55]
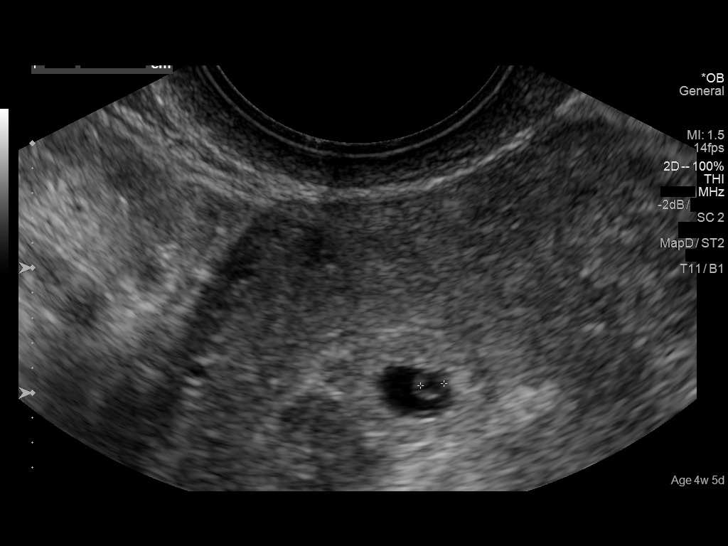
[im 31/55]
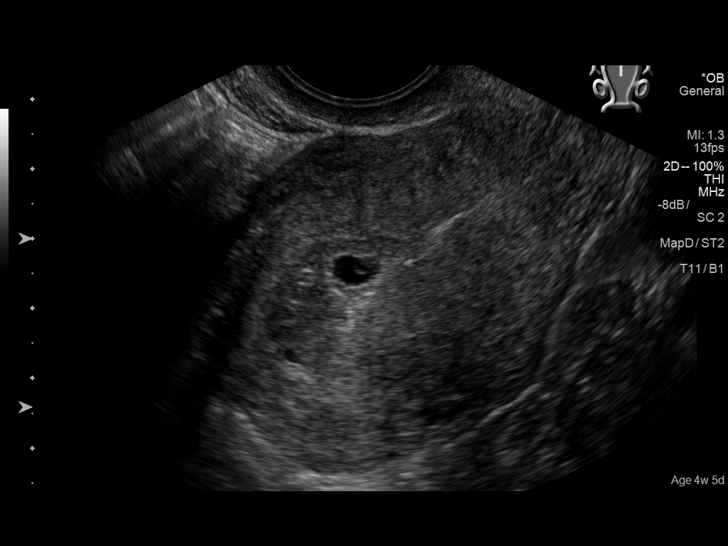
[im 35/55]
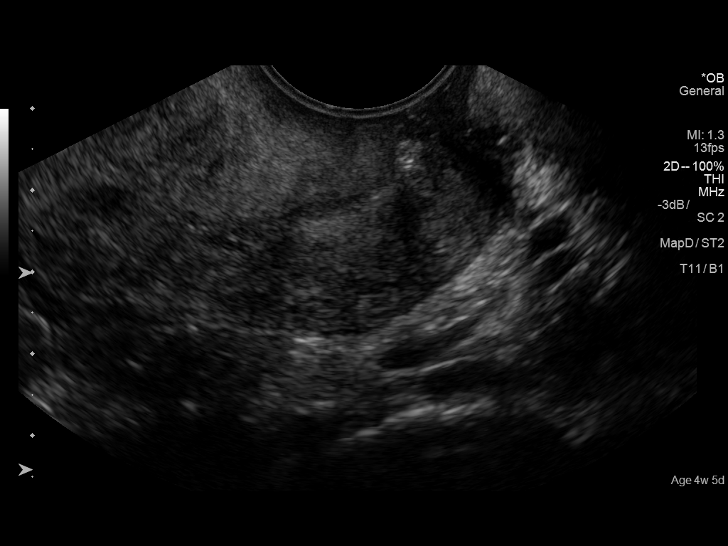
[im 39/55]
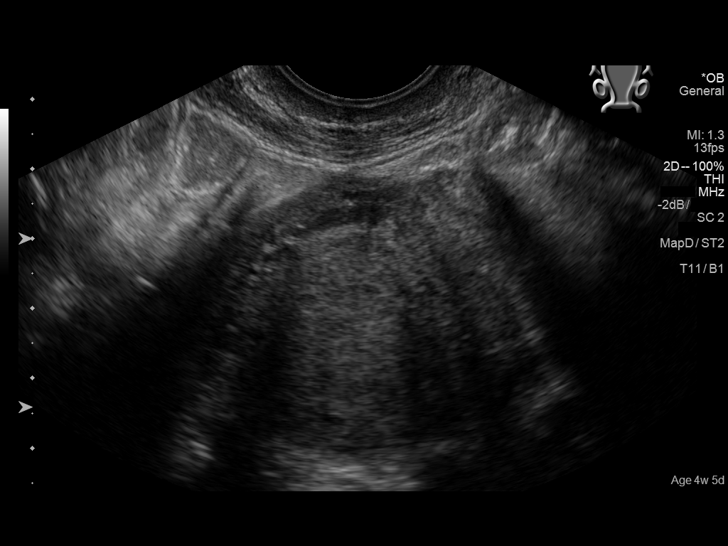
[im 43/55]
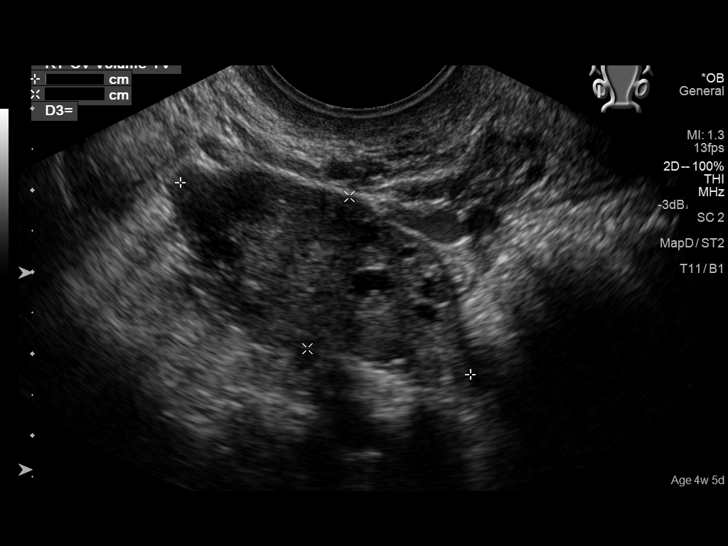
[im 47/55]
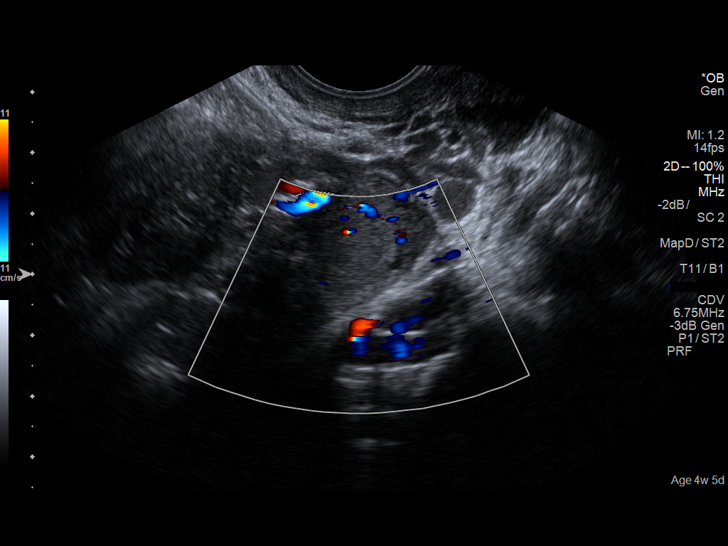
[im 51/55]
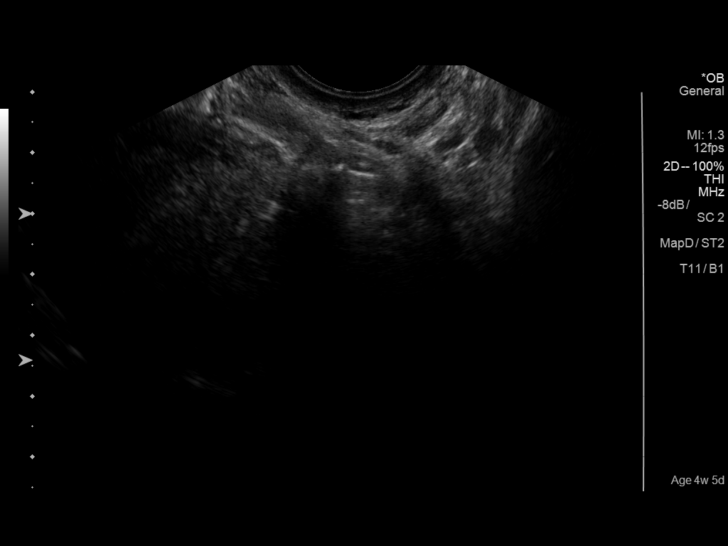
[im 55/55]
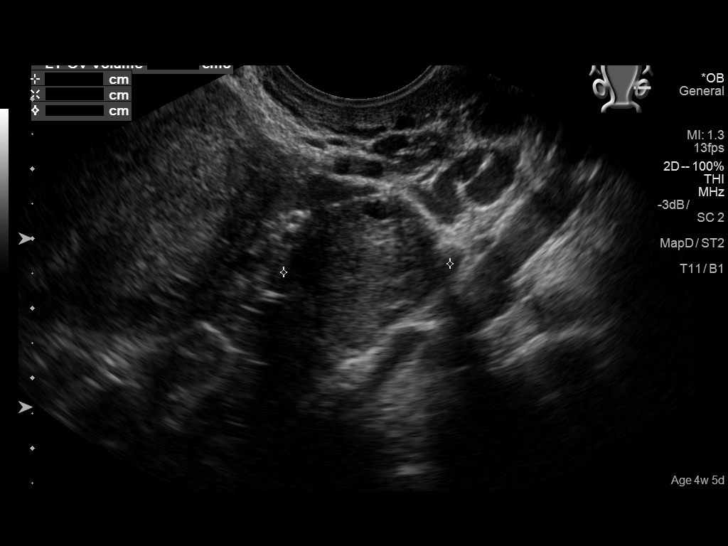

[14 of 28 positions shown; findings below may reference images not displayed]

FINDINGS: Intrauterine gestational sac: Present

Yolk sac:  Present

Embryo:  Not present

Cardiac Activity: Not applicable

MSD: 5  mm   5 w   2  d

Subchorionic hemorrhage:  Small subchorionic hemorrhage.

Maternal uterus/adnexae: Normal appearance of the adnexae. No free
fluid.
IMPRESSION: Probable early intrauterine gestational and yolk sac, without fetal
pole, or cardiac activity yet visualized. Small subchorionic
hemorrhage. Recommend follow-up quantitative B-HCG levels and
follow-up US in 14 days to confirm and assess viability. This
recommendation follows [facility] consensus guidelines: Diagnostic Criteria
for Nonviable Pregnancy Early in the First Trimester. N Engl J Med
